# Patient Record
Sex: Male | Born: 1995 | Race: Black or African American | Hispanic: No | Marital: Single | Smoking: Never smoker
Health system: Southern US, Community
[De-identification: ages and names within clinical notes are randomized; demographics above are authoritative.]

## PROBLEM LIST (undated history)

## (undated) DIAGNOSIS — F419 Anxiety disorder, unspecified: Secondary | ICD-10-CM

## (undated) DIAGNOSIS — R51 Headache: Secondary | ICD-10-CM

## (undated) DIAGNOSIS — K219 Gastro-esophageal reflux disease without esophagitis: Secondary | ICD-10-CM

## (undated) DIAGNOSIS — F3181 Bipolar II disorder: Secondary | ICD-10-CM

## (undated) DIAGNOSIS — F32A Depression, unspecified: Secondary | ICD-10-CM

## (undated) DIAGNOSIS — G43909 Migraine, unspecified, not intractable, without status migrainosus: Secondary | ICD-10-CM

## (undated) HISTORY — DX: Migraine, unspecified, not intractable, without status migrainosus: G43.909

## (undated) HISTORY — DX: Headache: R51

## (undated) HISTORY — PX: CIRCUMCISION: SHX1350

## (undated) HISTORY — DX: Depression, unspecified: F32.A

## (undated) HISTORY — DX: Gastro-esophageal reflux disease without esophagitis: K21.9

## (undated) HISTORY — DX: Anxiety disorder, unspecified: F41.9

## (undated) HISTORY — PX: UPPER GASTROINTESTINAL ENDOSCOPY: SHX188

## (undated) HISTORY — DX: Bipolar II disorder: F31.81

---
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
## 2018-12-18 ENCOUNTER — Other Ambulatory Visit: Payer: Self-pay

## 2018-12-18 ENCOUNTER — Encounter (HOSPITAL_COMMUNITY): Payer: Self-pay

## 2018-12-18 ENCOUNTER — Ambulatory Visit (HOSPITAL_COMMUNITY)
Admission: EM | Admit: 2018-12-18 | Discharge: 2018-12-18 | Disposition: A | Discharge status: Home / Self Care | Payer: BC Managed Care – PPO | Attending: Family Medicine | Admitting: Family Medicine

## 2018-12-18 DIAGNOSIS — R42 Dizziness and giddiness: Secondary | ICD-10-CM | POA: Diagnosis present

## 2018-12-18 LAB — BASIC METABOLIC PANEL
Anion gap: 12 (ref 5–15)
BUN: 16 mg/dL (ref 6–20)
CO2: 25 mmol/L (ref 22–32)
Calcium: 9.2 mg/dL (ref 8.9–10.3)
Chloride: 100 mmol/L (ref 98–111)
Creatinine, Ser: 1.13 mg/dL (ref 0.61–1.24)
GFR calc Af Amer: >60 mL/min (ref >60)
GFR calc non Af Amer: >60 mL/min (ref >60)
Glucose, Bld: 91 mg/dL (ref 70–99)
Potassium: 4.1 mmol/L (ref 3.5–5.1)
Sodium: 137 mmol/L (ref 135–145)

## 2018-12-18 LAB — CBC
HCT: 49.3 % (ref 39.0–52.0)
Hemoglobin: 16.1 g/dL (ref 13.0–17.0)
MCH: 26.8 pg (ref 26.0–34.0)
MCHC: 32.7 g/dL (ref 30.0–36.0)
MCV: 82.2 fL (ref 80.0–100.0)
Platelets: 216 10*3/uL (ref 150–400)
RBC: 6 MIL/uL — ABNORMAL HIGH (ref 4.22–5.81)
RDW: 13.6 % (ref 11.5–15.5)
WBC: 4.1 10*3/uL (ref 4.0–10.5)
nRBC: 0 % (ref 0.0–0.2)

## 2018-12-18 MED ORDER — MECLIZINE HCL 12.5 MG PO TABS: 12.5000 mg | ORAL_TABLET | Freq: Three times a day (TID) | ORAL | 0 refills | Status: DC | PRN

## 2018-12-18 NOTE — ED Provider Notes (Signed)
[facility]    CSN: [id number] Arrival date & time: 12/18/18  1645      History   Chief Complaint Chief Complaint  Patient presents with  . Dizziness    HPI Frank Dougherty is a 23 y.o. male no contributing past medical history presenting today for evaluation of lightheadedness.  Patient states that beginning Thursday he started to develop some lightheadedness and dizziness.  Symptoms are described as a sensation of feeling uncoordinated and difficulty focusing. Has a "fuzziness". Denies vision changes.  Has had a bitemporal headache.  Denies recent illness or URI symptoms.  Denies hearing changes or ringing in the ears.  Denies ear pain.  Denies history of similar.  Denies change in diet sleep.  Denies recent head injury or trauma.  No vomiting.  Feels the symptoms are triggered when he stands too quickly, changes position or turns his head.  Denies use of recreational drugs.  Denies tobacco use.  Denies chest pain or shortness of breath.  Does not take any daily medicines.  Denies any known medical history.  Drinks in between 100 to 133 ounces of water daily.  Works at [employer].  HPI  Past Medical History:  Diagnosis Date  . WUJWJXBJ(478.2Headache(784.0)     Patient Active Problem List   Diagnosis Date Noted  . Strain of quadriceps tendon 06/08/2012    Past Surgical History:  Procedure Laterality Date  . CIRCUMCISION         Home Medications    Prior to Admission medications   Medication Sig Start Date End Date Taking? Authorizing Provider  meclizine (ANTIVERT) 12.5 MG tablet Take 1 tablet (12.5 mg total) by mouth 3 (three) times daily as needed for dizziness. 12/18/18   Japheth Diekman, Junius CreamerHallie C, PA-C    Family History Family History  Problem Relation Age of Onset  . Hyperlipidemia Father   . Depression Mother   . Heart Problems Maternal Grandmother   . Depression Maternal Grandmother   . Cancer Paternal Grandmother   . Autism Cousin        Paternal 1st Cousin    Social  History Social History   Tobacco Use  . Smoking status: Never Smoker  . Smokeless tobacco: Never Used  Substance Use Topics  . Alcohol use: Yes    Comment: socially  . Drug use: No     Allergies   Amitriptyline, Prednisone, and Zofran [ondansetron hcl]   Review of Systems Review of Systems  Constitutional: Negative for fatigue and fever.  HENT: Negative for congestion, sinus pressure and sore throat.   Eyes: Negative for photophobia, pain and visual disturbance.  Respiratory: Negative for cough and shortness of breath.   Cardiovascular: Negative for chest pain.  Gastrointestinal: Negative for abdominal pain, nausea and vomiting.  Genitourinary: Negative for decreased urine volume and hematuria.  Musculoskeletal: Negative for myalgias, neck pain and neck stiffness.  Neurological: Positive for dizziness, light-headedness and headaches. Negative for syncope, facial asymmetry, speech difficulty, weakness and numbness.     Physical Exam Triage Vital Signs ED Triage Vitals  Enc Vitals Group     BP 12/18/18 1706 108/76     Pulse Rate 12/18/18 1706 79     Resp 12/18/18 1706 15     Temp 12/18/18 1706 97.9 F (36.6 C)     Temp Source 12/18/18 1706 Oral     SpO2 12/18/18 1706 99 %     Weight --      Height --      Head  Circumference --      Peak Flow --      Pain Score 12/18/18 1704 5     Pain Loc --      Pain Edu? --      Excl. in [place]? --    Orthostatic VS for the past 24 hrs:  BP- Lying Pulse- Lying BP- Sitting Pulse- Sitting BP- Standing at 0 minutes Pulse- Standing at 0 minutes  12/18/18 1756 108/74 72 117/79 99 105/69 84    Updated Vital Signs BP 108/76 (BP Location: Left Arm)   Pulse 79   Temp 97.9 F (36.6 C) (Oral)   Resp 15   SpO2 99%   Visual Acuity Right Eye Distance:   Left Eye Distance:   Bilateral Distance:    Right Eye Near:   Left Eye Near:    Bilateral Near:     Physical Exam Vitals signs and nursing note reviewed.  Constitutional:       Appearance: Normal appearance. He is well-developed and normal weight.  HENT:     Head: Normocephalic and atraumatic.     Ears:     Comments: Bilateral cerumen impaction    Mouth/Throat:     Comments: Oral mucosa pink and moist, no tonsillar enlargement or exudate. Posterior pharynx patent and nonerythematous, no uvula deviation or swelling. Normal phonation. Palate elevates symmetrically Eyes:     Extraocular Movements: Extraocular movements intact.     Conjunctiva/sclera: Conjunctivae normal.     Pupils: Pupils are equal, round, and reactive to light.  Neck:     Musculoskeletal: Neck supple.     Comments: Full active range of motion of neck Cardiovascular:     Rate and Rhythm: Normal rate and regular rhythm.     Heart sounds: No murmur.  Pulmonary:     Effort: Pulmonary effort is normal. No respiratory distress.     Breath sounds: Normal breath sounds.     Comments: Breathing comfortably at rest, CTABL, no wheezing, rales or other adventitious sounds auscultated Abdominal:     Palpations: Abdomen is soft.     Tenderness: There is no abdominal tenderness.  Skin:    General: Skin is warm and dry.  Neurological:     General: No focal deficit present.     Mental Status: He is alert and oriented to person, place, and time. Mental status is at baseline.     Comments: Patient A&O x3, cranial nerves II-XII grossly intact, strength at shoulders, hips and knees 5/5, equal bilaterally, patellar reflex 2+ bilaterally.  Negative Romberg and Pronator Drift. Gait without abnormality. Weakly positive Dix-Hallpike, did have slight reproduction of symptoms, but no nystagmus      UC Treatments / Results  Labs (all labs ordered are listed, but only abnormal results are displayed) Labs Reviewed  CBC  BASIC METABOLIC PANEL    EKG   Radiology No results found.  Procedures Procedures (including critical care time)  Medications Ordered in UC Medications - No data to display  Initial  Impression / Assessment and Plan / UC Course  I have reviewed the triage vital signs and the nursing notes.  Pertinent labs & imaging results that were available during my care of the patient were reviewed by me and considered in my medical decision making (see chart for details).     No neuro deficits, negative orthostatics.  Patient seems to be drinking appropriate amount of water.  Do not suspect dehydration.  Reproducible symptoms with Dix-Hallpike, will treat for BP PV, recommending  Epley/logroll maneuver, meclizine as needed.  Discussed general precautions.  Checking BMP and CBC as well.  Will call if abnormal.  Continue to monitor,Discussed strict return precautions. Patient verbalized understanding and is agreeable with plan.  Final Clinical Impressions(s) / UC Diagnoses   Final diagnoses:  Dizziness  Vertigo     Discharge Instructions     Symptoms likely vertigo Please read attched Try epley maneuver and " log roll/BBQ" maneuver 2-3 times daily to help decrease symptoms- look up on youtube with 'vertigo' Use meclizine as needed for symptoms-do not drive/work after taking, will cause drowsiness Continue to drink plenty of fluids I will call if blood work abnormal  Please follow up here or emergency room if developing worsening or persistent symptoms    ED Prescriptions    Medication Sig Dispense Auth. Provider   meclizine (ANTIVERT) 12.5 MG tablet Take 1 tablet (12.5 mg total) by mouth 3 (three) times daily as needed for dizziness. 20 tablet Adylynn Hertenstein, [place] C, PA-C     Controlled Substance Prescriptions [place] Controlled Substance Registry consulted? Not Applicable   Lew Dawes, [place] 12/18/18 1806

## 2018-12-18 NOTE — Discharge Instructions (Signed)
Symptoms likely vertigo Please read attched Try epley maneuver and " log roll/BBQ" maneuver 2-3 times daily to help decrease symptoms- look up on youtube with 'vertigo' Use meclizine as needed for symptoms-do not drive/work after taking, will cause drowsiness Continue to drink plenty of fluids I will call if blood work abnormal  Please follow up here or emergency room if developing worsening or persistent symptoms

## 2018-12-18 NOTE — ED Triage Notes (Signed)
Patient presents to Urgent Care with complaints of feelings of lightheadedness since the past few days. Patient reports he tries to stay hydrated, does not think he is diabetic.

## 2018-12-22 ENCOUNTER — Telehealth (HOSPITAL_COMMUNITY): Payer: Self-pay | Admitting: Emergency Medicine

## 2018-12-22 NOTE — Telephone Encounter (Signed)
Attempted to reach patient about labs. No answer at this time.

## 2019-01-09 ENCOUNTER — Other Ambulatory Visit: Payer: Self-pay

## 2019-01-09 ENCOUNTER — Encounter (HOSPITAL_COMMUNITY): Payer: Self-pay

## 2019-01-09 ENCOUNTER — Ambulatory Visit (HOSPITAL_COMMUNITY)
Admission: EM | Admit: 2019-01-09 | Discharge: 2019-01-09 | Disposition: A | Discharge status: Home / Self Care | Payer: BC Managed Care – PPO | Attending: Family Medicine | Admitting: Family Medicine

## 2019-01-09 DIAGNOSIS — J0110 Acute frontal sinusitis, unspecified: Secondary | ICD-10-CM | POA: Diagnosis not present

## 2019-01-09 MED ORDER — AMOXICILLIN-POT CLAVULANATE 875-125 MG PO TABS: 1.0000 | ORAL_TABLET | Freq: Two times a day (BID) | ORAL | 0 refills | Status: DC

## 2019-01-09 MED ORDER — FLUTICASONE PROPIONATE 50 MCG/ACT NA SUSP: 1.0000 | Freq: Every day | NASAL | 2 refills | Status: DC

## 2019-01-09 MED ORDER — CETIRIZINE HCL 10 MG PO TABS: 10.0000 mg | ORAL_TABLET | Freq: Every day | ORAL | 0 refills | Status: DC

## 2019-01-09 MED ORDER — DEXAMETHASONE SODIUM PHOSPHATE 10 MG/ML IJ SOLN
10.0000 mg | Freq: Once | INTRAMUSCULAR | Status: AC
  Administered 2019-01-09: 14:00:00 10 mg via INTRAMUSCULAR

## 2019-01-09 MED ORDER — DEXAMETHASONE SODIUM PHOSPHATE 10 MG/ML IJ SOLN
INTRAMUSCULAR | Status: AC
  Filled 2019-01-09: qty 1

## 2019-01-09 NOTE — Discharge Instructions (Signed)
Treating you for sinus infection. I believe this could be the cause of your symptoms.  Recommend follow up with primary if your symptoms continue  Flonase and zyrtec daily.  Steroid injection here in clinic Augmentin for infection  Take the medication as prescribed.

## 2019-01-09 NOTE — ED Triage Notes (Signed)
Patient presents to Urgent Care with complaints of dizziness ongoing since his last visit a few weeks ago. Patient reports he will need a work note because he was too dizzy to go in today. Endorses some nausea with the dizziness, did take his last meclizine yesterday which helps but now he is out of it. Pt reports staying hydrated.

## 2019-01-09 NOTE — ED Provider Notes (Signed)
[facility]    CSN: [id number] Arrival date & time: 01/09/19  1227      History   Chief Complaint Chief Complaint  Patient presents with  . Dizziness    HPI Frank Dougherty is a 23 y.o. male.   Patient is a 23 year old male with past medical history of headache.  He presents today with intermittent dizziness that is been ongoing over the past couple weeks.  He was seen here previously for same.  Reports some days are more severe than others with his symptoms.  He was having pretty severe dizziness last night and took meclizine with some relief.  He has had some associated nausea, postnasal drip, thick mucus at times with foul smell.  Frontal headache described as tension and pressure.  No vomiting, photophobia, vision changes.  He feels overall weak and has had lack of appetite.  No fever, cough, sore throat or ear pain.  No chest pain or shortness of breath.  No recent sick contacts or recent traveling.  ROS per HPI      Past Medical History:  Diagnosis Date  . WUJWJXBJ(478.2Headache(784.0)     Patient Active Problem List   Diagnosis Date Noted  . Strain of quadriceps tendon 06/08/2012    Past Surgical History:  Procedure Laterality Date  . CIRCUMCISION         Home Medications    Prior to Admission medications   Medication Sig Start Date End Date Taking? Authorizing Provider  amoxicillin-clavulanate (AUGMENTIN) 875-125 MG tablet Take 1 tablet by mouth every 12 (twelve) hours. 01/09/19   Dahlia ByesBast, Jahn Franchini A, NP  cetirizine (ZYRTEC) 10 MG tablet Take 1 tablet (10 mg total) by mouth daily. 01/09/19   Lessie Funderburke, Gloris Manchesterraci A, NP  fluticasone (FLONASE) 50 MCG/ACT nasal spray Place 1 spray into both nostrils daily. 01/09/19   Dahlia ByesBast, Elfie Costanza A, NP  meclizine (ANTIVERT) 12.5 MG tablet Take 1 tablet (12.5 mg total) by mouth 3 (three) times daily as needed for dizziness. 12/18/18   Wieters, Junius CreamerHallie C, PA-C    Family History Family History  Problem Relation Age of Onset  . Hyperlipidemia Father    . Depression Mother   . Heart Problems Maternal Grandmother   . Depression Maternal Grandmother   . Cancer Paternal Grandmother   . Autism Cousin        Paternal 1st Cousin    Social History Social History   Tobacco Use  . Smoking status: Never Smoker  . Smokeless tobacco: Never Used  Substance Use Topics  . Alcohol use: Yes    Comment: socially  . Drug use: No     Allergies   Amitriptyline, Prednisone, and Zofran [ondansetron hcl]   Review of Systems Review of Systems   Physical Exam Triage Vital Signs ED Triage Vitals  Enc Vitals Group     BP 01/09/19 1400 112/79     Pulse Rate 01/09/19 1400 84     Resp 01/09/19 1400 16     Temp 01/09/19 1400 98 F (36.7 C)     Temp Source 01/09/19 1400 Oral     SpO2 01/09/19 1400 98 %     Weight --      Height --      Head Circumference --      Peak Flow --      Pain Score 01/09/19 1251 4     Pain Loc --      Pain Edu? --      Excl. in [place]? --  No data found.  Updated Vital Signs BP 112/79 (BP Location: Left Arm)   Pulse 84   Temp 98 F (36.7 C) (Oral)   Resp 16   SpO2 98%   Visual Acuity Right Eye Distance:   Left Eye Distance:   Bilateral Distance:    Right Eye Near:   Left Eye Near:    Bilateral Near:     Physical Exam Vitals signs and nursing note reviewed.  Constitutional:      General: He is not in acute distress.    Appearance: Normal appearance. He is not ill-appearing, toxic-appearing or diaphoretic.  HENT:     Head: Normocephalic and atraumatic.     Right Ear: Tympanic membrane and ear canal normal.     Left Ear: Tympanic membrane and ear canal normal.     Nose: Congestion present.     Right Turbinates: Swollen.     Left Turbinates: Swollen.     Right Sinus: Frontal sinus tenderness present.     Left Sinus: Frontal sinus tenderness present.     Mouth/Throat:     Comments: Elongated uvula  Eyes:     Conjunctiva/sclera: Conjunctivae normal.  Neck:     Musculoskeletal: Normal range of  motion.  Pulmonary:     Effort: Pulmonary effort is normal.  Musculoskeletal: Normal range of motion.  Skin:    General: Skin is warm and dry.  Neurological:     Mental Status: He is alert.  Psychiatric:        Mood and Affect: Mood normal.      UC Treatments / Results  Labs (all labs ordered are listed, but only abnormal results are displayed) Labs Reviewed - No data to display  EKG   Radiology No results found.  Procedures Procedures (including critical care time)  Medications Ordered in UC Medications  dexamethasone (DECADRON) injection 10 mg (10 mg Intramuscular Given 01/09/19 1344)  dexamethasone (DECADRON) 10 MG/ML injection (has no administration in time range)    Initial Impression / Assessment and Plan / UC Course  I have reviewed the triage vital signs and the nursing notes.  Pertinent labs & imaging results that were available during my care of the patient were reviewed by me and considered in my medical decision making (see chart for details).     Bacterial sinusitis-symptoms consistent with this. Has had symptoms for approximately 3 weeks or more. Treating with dexamethasone injection here in clinic.  Sending Flonase and Zyrtec along with Augmentin to the pharmacy to treat Recommended follow-up primary care for any continued problems.  Final Clinical Impressions(s) / UC Diagnoses   Final diagnoses:  Acute non-recurrent frontal sinusitis     Discharge Instructions     Treating you for sinus infection. I believe this could be the cause of your symptoms.  Recommend follow up with primary if your symptoms continue  Flonase and zyrtec daily.  Steroid injection here in clinic Augmentin for infection  Take the medication as prescribed.     ED Prescriptions    Medication Sig Dispense Auth. Provider   fluticasone (FLONASE) 50 MCG/ACT nasal spray Place 1 spray into both nostrils daily. 16 g Randall Colden A, NP   cetirizine (ZYRTEC) 10 MG tablet Take  1 tablet (10 mg total) by mouth daily. 30 tablet Yarianna Varble A, NP   amoxicillin-clavulanate (AUGMENTIN) 875-125 MG tablet Take 1 tablet by mouth every 12 (twelve) hours. 14 tablet Thu Baggett A, NP     PDMP not reviewed this encounter.  Dahlia Byes A, NP 01/09/19 [phone]

## 2021-10-15 ENCOUNTER — Emergency Department (HOSPITAL_COMMUNITY)
Admission: EM | Admit: 2021-10-15 | Discharge: 2021-10-15 | Disposition: A | Discharge status: Home / Self Care | Payer: Managed Care, Other (non HMO) | Attending: Emergency Medicine | Admitting: Emergency Medicine

## 2021-10-15 ENCOUNTER — Other Ambulatory Visit: Payer: Self-pay

## 2021-10-15 ENCOUNTER — Encounter (HOSPITAL_COMMUNITY): Payer: Self-pay

## 2021-10-15 DIAGNOSIS — R42 Dizziness and giddiness: Secondary | ICD-10-CM | POA: Diagnosis not present

## 2021-10-15 DIAGNOSIS — F410 Panic disorder [episodic paroxysmal anxiety] without agoraphobia: Secondary | ICD-10-CM | POA: Diagnosis present

## 2021-10-15 DIAGNOSIS — R531 Weakness: Secondary | ICD-10-CM | POA: Insufficient documentation

## 2021-10-15 DIAGNOSIS — R0602 Shortness of breath: Secondary | ICD-10-CM | POA: Insufficient documentation

## 2021-10-15 LAB — COMPREHENSIVE METABOLIC PANEL
ALT: 26 U/L (ref 0–44)
AST: 23 U/L (ref 15–41)
Albumin: 4.4 g/dL (ref 3.5–5.0)
Alkaline Phosphatase: 65 U/L (ref 38–126)
Anion gap: 6 (ref 5–15)
BUN: 15 mg/dL (ref 6–20)
CO2: 26 mmol/L (ref 22–32)
Calcium: 9.4 mg/dL (ref 8.9–10.3)
Chloride: 107 mmol/L (ref 98–111)
Creatinine, Ser: 1.13 mg/dL (ref 0.61–1.24)
GFR, Estimated: >60 mL/min (ref >60)
Glucose, Bld: 99 mg/dL (ref 70–99)
Potassium: 4 mmol/L (ref 3.5–5.1)
Sodium: 139 mmol/L (ref 135–145)
Total Bilirubin: 1 mg/dL (ref 0.3–1.2)
Total Protein: 7.5 g/dL (ref 6.5–8.1)

## 2021-10-15 LAB — CBC WITH DIFFERENTIAL/PLATELET
Abs Immature Granulocytes: 0.01 10*3/uL (ref 0.00–0.07)
Basophils Absolute: 0 10*3/uL (ref 0.0–0.1)
Basophils Relative: 1 %
Eosinophils Absolute: 0 10*3/uL (ref 0.0–0.5)
Eosinophils Relative: 1 %
HCT: 46.7 % (ref 39.0–52.0)
Hemoglobin: 15.6 g/dL (ref 13.0–17.0)
Immature Granulocytes: 0 %
Lymphocytes Relative: 24 %
Lymphs Abs: 1.1 10*3/uL (ref 0.7–4.0)
MCH: 27 pg (ref 26.0–34.0)
MCHC: 33.4 g/dL (ref 30.0–36.0)
MCV: 80.8 fL (ref 80.0–100.0)
Monocytes Absolute: 0.5 10*3/uL (ref 0.1–1.0)
Monocytes Relative: 10 %
Neutro Abs: 3.1 10*3/uL (ref 1.7–7.7)
Neutrophils Relative %: 64 %
Platelets: 201 10*3/uL (ref 150–400)
RBC: 5.78 MIL/uL (ref 4.22–5.81)
RDW: 13.5 % (ref 11.5–15.5)
WBC: 4.8 10*3/uL (ref 4.0–10.5)
nRBC: 0 % (ref 0.0–0.2)

## 2021-10-15 LAB — BLOOD GAS, VENOUS
Acid-Base Excess: 5 mmol/L — ABNORMAL HIGH (ref 0.0–2.0)
Bicarbonate: 31.5 mmol/L — ABNORMAL HIGH (ref 20.0–28.0)
O2 Saturation: 43.7 %
Patient temperature: 37
pCO2, Ven: 52 mmHg (ref 44–60)
pH, Ven: 7.39 (ref 7.25–7.43)
pO2, Ven: <31 mmHg — CL (ref 32–45)

## 2021-10-15 NOTE — ED Provider Notes (Signed)
[facility]EMERGENCY [facility]PT Provider Note   CSN: [id number] Arrival date & time: 10/15/21  2030     History  Chief Complaint  Patient presents with   Dizziness   Shortness of Breath    Frank Dougherty is a 26 y.o. male.  HPI Patient was at work today when he suddenly had weakness in his arms and legs.  He knelt down, and had trouble standing up.  He did not lose consciousness.  EMS was contacted and they brought him here for evaluation.  During the episode he had a sensation of dizziness and shortness of breath.  He has a remote history of anxiety and panic attack, 7 years ago had a similar episode.  Today he was working, as usual and did not have any significant stress.  He does not currently take any medicines.  He feels better now but states his arms and legs are both shaky when he tries to move them.  He denies headache.  He has been treated in the past for tension headache.    Home Medications Prior to Admission medications   Medication Sig Start Date End Date Taking? Authorizing Provider  amoxicillin-clavulanate (AUGMENTIN) 875-125 MG tablet Take 1 tablet by mouth every 12 (twelve) hours. 01/09/19   Dahlia Byes A, NP  cetirizine (ZYRTEC) 10 MG tablet Take 1 tablet (10 mg total) by mouth daily. 01/09/19   Bast, Gloris [place] A, NP  fluticasone (FLONASE) 50 MCG/ACT nasal spray Place 1 spray into both nostrils daily. 01/09/19   Dahlia Byes A, NP  meclizine (ANTIVERT) 12.5 MG tablet Take 1 tablet (12.5 mg total) by mouth 3 (three) times daily as needed for dizziness. 12/18/18   Wieters, Hallie C, PA-C      Allergies    Amitriptyline, Prednisone, and Zofran [ondansetron hcl]    Review of Systems   Review of Systems  Physical Exam Updated Vital Signs BP 130/81 (BP Location: Right Arm)   Pulse 78   Temp 97.8 F (36.6 C) (Oral)   Resp 16   Ht 5\' 9"  (1.753 m)   Wt 66.2 kg   SpO2 100%   BMI 21.56 kg/m  Physical Exam Vitals and nursing note reviewed.  Constitutional:       Appearance: He is well-developed. He is not ill-appearing.  HENT:     Head: Normocephalic and atraumatic.     Right Ear: External ear normal.     Left Ear: External ear normal.  Eyes:     Conjunctiva/sclera: Conjunctivae normal.     Pupils: Pupils are equal, round, and reactive to light.  Neck:     Trachea: Phonation normal.  Cardiovascular:     Rate and Rhythm: Normal rate.  Pulmonary:     Effort: Pulmonary effort is normal.  Abdominal:     General: There is no distension.  Musculoskeletal:        General: Normal range of motion.     Cervical back: Normal range of motion and neck supple.  Skin:    General: Skin is warm and dry.  Neurological:     Mental Status: He is alert and oriented to person, place, and time.     Cranial Nerves: No cranial nerve deficit.     Sensory: No sensory deficit.     Motor: No abnormal muscle tone.     Coordination: Coordination normal.  Psychiatric:        Mood and Affect: Mood normal.        Behavior: Behavior normal.  Thought Content: Thought content normal.        Judgment: Judgment normal.     ED Results / Procedures / Treatments   Labs (all labs ordered are listed, but only abnormal results are displayed) Labs Reviewed  COMPREHENSIVE METABOLIC PANEL  BLOOD GAS, VENOUS  CBC WITH DIFFERENTIAL/PLATELET    EKG None  Radiology No results found.  Procedures Procedures    Medications Ordered in ED Medications - No data to display  ED Course/ Medical Decision Making/ A&P                           Medical Decision Making Patient presenting with signs and symptoms of panic attack.  No significant ongoing anxiety or stress.  He presents [place] for evaluation.  We will order screening evaluation to check for cardiopulmonary instability and occult electrolyte abnormalities.  Doubt stroke, spinal myelopathy, acute coronary syndrome.  Problems Addressed: Panic attack: acute illness or injury  Amount and/or Complexity of  Data Reviewed Independent Historian:     Details: He is a cogent historian Labs: ordered.    Details: CBC, metabolic panel, venous blood gas-all normal findings ECG/medicine tests: ordered and independent interpretation performed.    Details: Cardiac monitor -- normal sinus rhythm  Risk Decision regarding hospitalization. Risk Details: Patient with transient symptoms, likely panic attack, screening evaluation is negative for acute metabolic disorders, cardiac disorders, hemodynamic instability.  He does not require hospitalization for management of these disorders.  I suspect nonspecific anxiety versus panic attack.  He can be discharged as stable, with outpatient management as needed.  He does not require hospitalization.           Final Clinical Impression(s) / ED Diagnoses Final diagnoses:  None    Rx / DC Orders ED Discharge Orders     None         Mancel Bale, MD 10/17/21 1034

## 2021-10-15 NOTE — Discharge Instructions (Signed)
It appears that you had a panic attack today.  If this continues you will need to have some treatment for it.  Make sure you are getting plenty of rest and try to eat a regular diet.  Return here, if needed.

## 2021-10-15 NOTE — ED Triage Notes (Addendum)
BIBA from work for sudden onset of dizziness and SHOB, has hx anxiety, vss, normal EKG  18 LAC

## 2022-09-29 ENCOUNTER — Emergency Department (HOSPITAL_COMMUNITY): Payer: Managed Care, Other (non HMO)

## 2022-09-29 ENCOUNTER — Emergency Department (HOSPITAL_COMMUNITY)
Admission: EM | Admit: 2022-09-29 | Discharge: 2022-09-29 | Disposition: A | Discharge status: Home / Self Care | Payer: Managed Care, Other (non HMO) | Attending: Emergency Medicine | Admitting: Emergency Medicine

## 2022-09-29 ENCOUNTER — Other Ambulatory Visit: Payer: Self-pay

## 2022-09-29 ENCOUNTER — Encounter (HOSPITAL_COMMUNITY): Payer: Self-pay

## 2022-09-29 ENCOUNTER — Encounter: Payer: Self-pay | Admitting: Emergency Medicine

## 2022-09-29 ENCOUNTER — Ambulatory Visit
Admission: EM | Admit: 2022-09-29 | Discharge: 2022-09-29 | Disposition: A | Discharge status: Other Acute Inpatient Hospital | Payer: Managed Care, Other (non HMO)

## 2022-09-29 DIAGNOSIS — R1084 Generalized abdominal pain: Secondary | ICD-10-CM | POA: Diagnosis not present

## 2022-09-29 DIAGNOSIS — D72819 Decreased white blood cell count, unspecified: Secondary | ICD-10-CM | POA: Insufficient documentation

## 2022-09-29 DIAGNOSIS — R1013 Epigastric pain: Secondary | ICD-10-CM | POA: Insufficient documentation

## 2022-09-29 DIAGNOSIS — R1032 Left lower quadrant pain: Secondary | ICD-10-CM | POA: Diagnosis present

## 2022-09-29 LAB — CBC
HCT: 49 % (ref 39.0–52.0)
Hemoglobin: 15.9 g/dL (ref 13.0–17.0)
MCH: 26.5 pg (ref 26.0–34.0)
MCHC: 32.4 g/dL (ref 30.0–36.0)
MCV: 81.7 fL (ref 80.0–100.0)
Platelets: 209 10*3/uL (ref 150–400)
RBC: 6 MIL/uL — ABNORMAL HIGH (ref 4.22–5.81)
RDW: 13.1 % (ref 11.5–15.5)
WBC: 2.9 10*3/uL — ABNORMAL LOW (ref 4.0–10.5)
nRBC: 0 % (ref 0.0–0.2)

## 2022-09-29 LAB — COMPREHENSIVE METABOLIC PANEL
ALT: 16 U/L (ref 0–44)
AST: 19 U/L (ref 15–41)
Albumin: 4.4 g/dL (ref 3.5–5.0)
Alkaline Phosphatase: 63 U/L (ref 38–126)
Anion gap: 12 (ref 5–15)
BUN: 11 mg/dL (ref 6–20)
CO2: 26 mmol/L (ref 22–32)
Calcium: 9.4 mg/dL (ref 8.9–10.3)
Chloride: 99 mmol/L (ref 98–111)
Creatinine, Ser: 1.11 mg/dL (ref 0.61–1.24)
GFR, Estimated: >60 mL/min (ref >60)
Glucose, Bld: 99 mg/dL (ref 70–99)
Potassium: 3.7 mmol/L (ref 3.5–5.1)
Sodium: 137 mmol/L (ref 135–145)
Total Bilirubin: 0.7 mg/dL (ref 0.3–1.2)
Total Protein: 7.3 g/dL (ref 6.5–8.1)

## 2022-09-29 LAB — URINALYSIS, ROUTINE W REFLEX MICROSCOPIC
Bilirubin Urine: NEGATIVE
Glucose, UA: NEGATIVE mg/dL
Hgb urine dipstick: NEGATIVE
Ketones, ur: NEGATIVE mg/dL
Leukocytes,Ua: NEGATIVE
Nitrite: NEGATIVE
Protein, ur: NEGATIVE mg/dL
Specific Gravity, Urine: 1.018 (ref 1.005–1.030)
pH: 7 (ref 5.0–8.0)

## 2022-09-29 LAB — TYPE AND SCREEN
ABO/RH(D): O POS
Antibody Screen: NEGATIVE

## 2022-09-29 LAB — LIPASE, BLOOD: Lipase: 27 U/L (ref 11–51)

## 2022-09-29 MED ORDER — FAMOTIDINE 20 MG PO TABS
20.0000 mg | ORAL_TABLET | Freq: Once | ORAL | Status: AC
  Administered 2022-09-29: 20 mg via ORAL
  Filled 2022-09-29: qty 1

## 2022-09-29 MED ORDER — IOHEXOL 350 MG/ML SOLN
60.0000 mL | Freq: Once | INTRAVENOUS | Status: AC | PRN
  Administered 2022-09-29: 60 mL via INTRAVENOUS

## 2022-09-29 MED ORDER — ACETAMINOPHEN 325 MG PO TABS
650.0000 mg | ORAL_TABLET | Freq: Once | ORAL | Status: AC
  Administered 2022-09-29: 650 mg via ORAL
  Filled 2022-09-29: qty 2

## 2022-09-29 MED ORDER — SODIUM CHLORIDE 0.9 % IV BOLUS
1000.0000 mL | Freq: Once | INTRAVENOUS | Status: AC
  Administered 2022-09-29: 1000 mL via INTRAVENOUS

## 2022-09-29 NOTE — ED Provider Notes (Signed)
[facility]    CSN: [id number] Arrival date & time: 09/29/22  1216      History   Chief Complaint Chief Complaint  Patient presents with   Abdominal Pain    HPI Frank Dougherty is a 27 y.o. male.   Patient complains of nominal pain for the past week.  Patient reports that he had vomiting and diarrhea.  Patient took Pepto-Bismol with some relief patient reports having black stool after Pepto but he is now seeing some bright red blood.  Patient reports he has had persistent pain in his left upper abdomen in his left and right lower abdomen.  Patient complains of pain when he walks.  Patient reports difficulty getting comfortable  The history is provided by the patient. No language interpreter was used.  Abdominal Pain Pain location:  LUQ, LLQ and RLQ   Past Medical History:  Diagnosis Date   Headache(784.0)     Patient Active Problem List   Diagnosis Date Noted   Strain of quadriceps tendon 06/08/2012    Past Surgical History:  Procedure Laterality Date   CIRCUMCISION         Home Medications    Prior to Admission medications   Medication Sig Start Date End Date Taking? Authorizing Provider  amoxicillin-clavulanate (AUGMENTIN) 875-125 MG tablet Take 1 tablet by mouth every 12 (twelve) hours. Patient not taking: Reported on 09/29/2022 01/09/19   Dahlia Byes A, NP  cetirizine (ZYRTEC) 10 MG tablet Take 1 tablet (10 mg total) by mouth daily. 01/09/19   Bast, Gloris Manchester A, NP  fluticasone (FLONASE) 50 MCG/ACT nasal spray Place 1 spray into both nostrils daily. 01/09/19   Dahlia Byes A, NP  meclizine (ANTIVERT) 12.5 MG tablet Take 1 tablet (12.5 mg total) by mouth 3 (three) times daily as needed for dizziness. 12/18/18   Wieters, Junius Creamer, PA-C    Family History Family History  Problem Relation Age of Onset   Hyperlipidemia Father    Depression Mother    Heart Problems Maternal Grandmother    Depression Maternal Grandmother    Cancer Paternal Grandmother     Autism Cousin        Paternal 1st Cousin    Social History Social History   Tobacco Use   Smoking status: Never   Smokeless tobacco: Never  Vaping Use   Vaping Use: Never used  Substance Use Topics   Alcohol use: Yes    Comment: socially   Drug use: No     Allergies   Amitriptyline, Prednisone, and Zofran [ondansetron hcl]   Review of Systems Review of Systems  Gastrointestinal:  Positive for abdominal pain.  All other systems reviewed and are negative.    Physical Exam Triage Vital Signs ED Triage Vitals [09/29/22 1336]  Enc Vitals Group     BP 127/75     Pulse Rate 92     Resp 18     Temp 97.9 F (36.6 C)     Temp Source Oral     SpO2 99 %     Weight      Height      Head Circumference      Peak Flow      Pain Score 3     Pain Loc      Pain Edu?      Excl. in GC?    No data found.  Updated Vital Signs BP 127/75 (BP Location: Left Arm)   Pulse 92   Temp 97.9 F (36.6  C) (Oral)   Resp 18   SpO2 99%   Visual Acuity Right Eye Distance:   Left Eye Distance:   Bilateral Distance:    Right Eye Near:   Left Eye Near:    Bilateral Near:     Physical Exam Vitals and nursing note reviewed.  Constitutional:      Appearance: He is well-developed.  HENT:     Head: Normocephalic.  Cardiovascular:     Rate and Rhythm: Normal rate and regular rhythm.  Pulmonary:     Effort: Pulmonary effort is normal.  Abdominal:     General: Abdomen is flat. Bowel sounds are normal. There is no distension.     Palpations: Abdomen is soft.     Tenderness: There is abdominal tenderness in the right lower quadrant, left upper quadrant and left lower quadrant.  Musculoskeletal:        General: Normal range of motion.     Cervical back: Normal range of motion.  Skin:    General: Skin is warm.  Neurological:     Mental Status: He is alert and oriented to person, place, and time.      UC Treatments / Results  Labs (all labs ordered are listed, but only  abnormal results are displayed) Labs Reviewed - No data to display  EKG   Radiology No results found.  Procedures Procedures (including critical care time)  Medications Ordered in UC Medications - No data to display  Initial Impression / Assessment and Plan / UC Course  I have reviewed the triage vital signs and the nursing notes.  Pertinent labs & imaging results that were available during my care of the patient were reviewed by me and considered in my medical decision making (see chart for details).    Patient has pain to palpation.  He has guarding.  I advised patient I think he would benefit from laboratory evaluation and assessment in the emergency department.  Final Clinical Impressions(s) / UC Diagnoses   Final diagnoses:  Generalized abdominal pain   Discharge Instructions   None    ED Prescriptions   None    PDMP not reviewed this encounter. An After Visit Summary was printed and given to the patient.    Elson Areas, [place] 09/29/22 1407

## 2022-09-29 NOTE — ED Triage Notes (Signed)
Pt sts some upper abd pain with nausea x 1 week; pt sts one week ago had day of N/V/D but sts stomach upset since

## 2022-09-29 NOTE — ED Notes (Signed)
Patient is being discharged from the Urgent Care and sent to the Emergency Department via POV . Per KS, patient is in need of higher level of care due to abd pain. Patient is aware and verbalizes understanding of plan of care.  Vitals:   09/29/22 1336  BP: 127/75  Pulse: 92  Resp: 18  Temp: 97.9 F (36.6 C)  SpO2: 99%

## 2022-09-29 NOTE — ED Triage Notes (Signed)
Pt c/o upper abdominal pain x1 week.  Pain score 7/10.  Pt reports nausea and diarrhea during first few days, but denies since.  Pt reports dark brown w/ bright red stool yesterday.  Pt reports he has been taking Pepto-Bismol.

## 2022-09-29 NOTE — ED Provider Notes (Signed)
[place] EMERGENCY DEPARTMENT AT [facility] Provider Note   CSN: [id number] Arrival date & time: 09/29/22  1532     History  Chief Complaint  Patient presents with   Abdominal Pain    Frank Dougherty is a 27 y.o. male.  Patient is a 26 yo male with no significant pmh presenting for abdominal pain. Patient admits to lower abdominal pain with radiation to the left lower abdomen x 8 days. Admits to episodes of nausea and vomiting last week that is now resolved. Had small amount of bright red blood in stool yesterday. Did take Peptobismol last week with nausea/vomiting and had constipation afterward for 7 days. Denies current fever, chills, nausea, or vomiting. States he has worsening of lower abdominal pain immediately after eating.   The history is provided by the patient. No language interpreter was used.  Abdominal Pain Associated symptoms: no chest pain, no chills, no cough, no dysuria, no fever, no hematuria, no shortness of breath, no sore throat and no vomiting        Home Medications Prior to Admission medications   Medication Sig Start Date End Date Taking? Authorizing Provider  amoxicillin-clavulanate (AUGMENTIN) 875-125 MG tablet Take 1 tablet by mouth every 12 (twelve) hours. Patient not taking: Reported on 09/29/2022 01/09/19   Dahlia Byes A, NP  cetirizine (ZYRTEC) 10 MG tablet Take 1 tablet (10 mg total) by mouth daily. 01/09/19   Bast, Gloris Manchester A, NP  fluticasone (FLONASE) 50 MCG/ACT nasal spray Place 1 spray into both nostrils daily. 01/09/19   Dahlia Byes A, NP  meclizine (ANTIVERT) 12.5 MG tablet Take 1 tablet (12.5 mg total) by mouth 3 (three) times daily as needed for dizziness. 12/18/18   Wieters, Hallie C, PA-C      Allergies    Amitriptyline, Prednisone, and Zofran [ondansetron hcl]    Review of Systems   Review of Systems  Constitutional:  Negative for chills and fever.  HENT:  Negative for ear pain and sore throat.   Eyes:  Negative for pain and  visual disturbance.  Respiratory:  Negative for cough and shortness of breath.   Cardiovascular:  Negative for chest pain and palpitations.  Gastrointestinal:  Positive for abdominal pain and blood in stool. Negative for vomiting.  Genitourinary:  Negative for dysuria and hematuria.  Musculoskeletal:  Negative for arthralgias and back pain.  Skin:  Negative for color change and rash.  Neurological:  Negative for seizures and syncope.  All other systems reviewed and are negative.   Physical Exam Updated Vital Signs BP 111/70   Pulse 63   Temp 98.2 F (36.8 C) (Oral)   Resp 15   Ht 6\' 1"  (1.854 m)   Wt 59.9 kg   SpO2 100%   BMI 17.42 kg/m  Physical Exam Vitals and nursing note reviewed.  Constitutional:      General: He is not in acute distress.    Appearance: He is well-developed.  HENT:     Head: Normocephalic and atraumatic.  Eyes:     Conjunctiva/sclera: Conjunctivae normal.  Cardiovascular:     Rate and Rhythm: Normal rate and regular rhythm.     Heart sounds: No murmur heard. Pulmonary:     Effort: Pulmonary effort is normal. No respiratory distress.     Breath sounds: Normal breath sounds.  Abdominal:     Palpations: Abdomen is soft.     Tenderness: There is no abdominal tenderness.  Musculoskeletal:        General:  No swelling.     Cervical back: Neck supple.  Skin:    General: Skin is warm and dry.     Capillary Refill: Capillary refill takes less than 2 seconds.  Neurological:     Mental Status: He is alert.  Psychiatric:        Mood and Affect: Mood normal.     ED Results / Procedures / Treatments   Labs (all labs ordered are listed, but only abnormal results are displayed) Labs Reviewed  CBC - Abnormal; Notable for the following components:      Result Value   WBC 2.9 (*)    RBC 6.00 (*)    All other components within normal limits  COMPREHENSIVE METABOLIC PANEL  LIPASE, BLOOD  URINALYSIS, ROUTINE W REFLEX MICROSCOPIC  TYPE AND SCREEN     EKG EKG Interpretation  Date/Time:  Tuesday September 29 2022 16:44:15 EDT Ventricular Rate:  79 PR Interval:  136 QRS Duration: 76 QT Interval:  318 QTC Calculation: 364 R Axis:   74 Text Interpretation: Normal sinus rhythm Normal ECG When compared with ECG of 15-Oct-2021 21:24, PREVIOUS ECG IS PRESENT No sig changes Baseline wander and artifact in leads I, II Confirmed by Alvester Chou [phone]) on 09/30/2022 10:58:21 AM  Radiology No results found.  Procedures Procedures    Medications Ordered in ED Medications  acetaminophen (TYLENOL) tablet 650 mg (650 mg Oral Given 09/29/22 1900)  sodium chloride 0.9 % bolus 1,000 mL (0 mLs Intravenous Stopped 09/29/22 2111)  iohexol (OMNIPAQUE) 350 MG/ML injection 60 mL (60 mLs Intravenous Contrast Given 09/29/22 2054)  famotidine (PEPCID) tablet 20 mg (20 mg Oral Given 09/29/22 2113)    ED Course/ Medical Decision Making/ A&P                             Medical Decision Making Amount and/or Complexity of Data Reviewed Labs: ordered. Radiology: ordered.  Risk OTC drugs. Prescription drug management.   1:27 PM 27 yo male with no significant pmh presenting for abdominal pain. Pt is Aox3, no acute distress, afebrile, with stable vitals. Physical exam demonstrates soft abdomen without guarding or rebound. No tenderness currently. No active vomiting. Pt is nontoxic appearing. Differential diagnosis includes but is not limited to gastritis, peptic ulcer disease, pancreatitis. Lower on differential but considered due to generalized abdominal complaints included but not limited to diverticulitis, appendicitis, etc.   Laboratory studies demonstrate stable lipase, renal function, and liver profile. No leukocytosis no signs/symptoms of sepsis. Ct abd/pelvis demonstrates no acute process. No diverticulitis. No appendicitis. Higher concern for gastritis and/or peptic ulcer disease. Pepcid given in ED.  Patient recommended for close GI outpatient  follow up if abdominal pain continues.   Patient in no distress and overall condition improved here in the ED. Detailed discussions were had with the patient regarding current findings, and need for close f/u with PCP or on call doctor. The patient has been instructed to return immediately if the symptoms worsen in any way for re-evaluation. Patient verbalized understanding and is in agreement with current care plan. All questions answered prior to discharge.         Final Clinical Impression(s) / ED Diagnoses Final diagnoses:  Leukopenia, unspecified type  Epigastric abdominal pain    Rx / DC Orders ED Discharge Orders     None         Franne Forts, DO 10/04/22 1330

## 2022-09-29 NOTE — Discharge Instructions (Signed)
Go to the Emergency department for evaluation  

## 2022-10-01 ENCOUNTER — Encounter: Payer: Self-pay | Admitting: Gastroenterology

## 2022-12-08 ENCOUNTER — Encounter: Payer: Self-pay | Admitting: Gastroenterology

## 2022-12-08 ENCOUNTER — Ambulatory Visit (INDEPENDENT_AMBULATORY_CARE_PROVIDER_SITE_OTHER): Payer: Managed Care, Other (non HMO) | Admitting: Gastroenterology

## 2022-12-08 VITALS — BP 106/70 | HR 106 | Ht 74.0 in | Wt 137.0 lb

## 2022-12-08 DIAGNOSIS — K219 Gastro-esophageal reflux disease without esophagitis: Secondary | ICD-10-CM | POA: Diagnosis not present

## 2022-12-08 DIAGNOSIS — R112 Nausea with vomiting, unspecified: Secondary | ICD-10-CM | POA: Diagnosis not present

## 2022-12-08 DIAGNOSIS — R1013 Epigastric pain: Secondary | ICD-10-CM

## 2022-12-08 MED ORDER — PANTOPRAZOLE SODIUM 40 MG PO TBEC: 40.0000 mg | DELAYED_RELEASE_TABLET | Freq: Every day | ORAL | 3 refills | Status: DC

## 2022-12-08 NOTE — Patient Instructions (Addendum)
Stop famotidine and start pantoprazole   You have been scheduled for an endoscopy. Please follow written instructions given to you at your visit today.  If you use inhalers (even only as needed), please bring them with you on the day of your procedure.  If you take any of the following medications, they will need to be adjusted prior to your procedure:   DO NOT TAKE 7 DAYS PRIOR TO TEST- Trulicity (dulaglutide) Ozempic, Wegovy (semaglutide) Mounjaro (tirzepatide) Bydureon Bcise (exanatide extended release)  DO NOT TAKE 1 DAY PRIOR TO YOUR TEST Rybelsus (semaglutide) Adlyxin (lixisenatide) Victoza (liraglutide) Byetta (exanatide) ___________________________________________________________________________     _______________________________________________________  If your blood pressure at your visit was 140/90 or greater, please contact your primary care physician to follow up on this.  _______________________________________________________  If you are age 62 or older, your body mass index should be between 23-30. Your Body mass index is 17.59 kg/m. If this is out of the aforementioned range listed, please consider follow up with your Primary Care Provider.  If you are age 57 or younger, your body mass index should be between 19-25. Your Body mass index is 17.59 kg/m. If this is out of the aformentioned range listed, please consider follow up with your Primary Care Provider.   ________________________________________________________  The [facility] providers would like to encourage you to use [facility] to communicate with providers for non-urgent requests or questions.  Due to long hold times on the telephone, sending your provider a message by [facility] may be a faster and more efficient way to get a response.  Please allow 48 business hours for a response.  Please remember that this is for non-urgent requests.  _______________________________________________________ It was a  pleasure to see you today!  Thank you for trusting me with your gastrointestinal care!

## 2022-12-08 NOTE — Progress Notes (Signed)
Chief Complaint: Epigastric pain Primary GI MD: Gentry Fitz  HPI: 27 year old male with no significant past medical history presents for hospital follow-up  Patient seen 09/29/2022 at [facility] emergency department with epigastric pain ongoing for 8 days with associated nausea and vomiting  Workup revealed stable lipase, liver profile, and new leukocytosis.  CT abdomen pelvis showed no acute process.  Patient was discharged with Pepcid and concern for gastritis/PUD.  Patient states since being put on famotidine 20 Mg once daily his symptoms have improved although he does report breakthrough symptoms.  States spicy foods do not bother him but sometimes acidic foods do.  He reports epigastric pain and GERD.  States he was told he had an ulcer in high school but has had no endoscopic evaluation.  At that time he was taking a significant amount of ibuprofen but has not taken NSAIDs since.  His mother has significant reflux.  He graduated from [employer] and works second shift in [employer]  PREVIOUS GI WORKUP   CT abdomen pelvis with contrast 09/29/2022 - Pancreas, liver, gallbladder all unremarkable - Normal bowel and stomach  Past Medical History:  Diagnosis Date   Headache(784.0)     Past Surgical History:  Procedure Laterality Date   CIRCUMCISION      Current Outpatient Medications  Medication Sig Dispense Refill   amoxicillin-clavulanate (AUGMENTIN) 875-125 MG tablet Take 1 tablet by mouth every 12 (twelve) hours. (Patient not taking: Reported on 09/29/2022) 14 tablet 0   cetirizine (ZYRTEC) 10 MG tablet Take 1 tablet (10 mg total) by mouth daily. 30 tablet 0   fluticasone (FLONASE) 50 MCG/ACT nasal spray Place 1 spray into both nostrils daily. 16 g 2   meclizine (ANTIVERT) 12.5 MG tablet Take 1 tablet (12.5 mg total) by mouth 3 (three) times daily as needed for dizziness. 20 tablet 0   No current facility-administered medications for this visit.    Allergies as of 12/08/2022 -  Review Complete 09/29/2022  Allergen Reaction Noted   Amitriptyline  12/18/2018   Prednisone  12/27/2012   Zofran [ondansetron hcl]  12/18/2018    Family History  Problem Relation Age of Onset   Hyperlipidemia Father    Depression Mother    Heart Problems Maternal Grandmother    Depression Maternal Grandmother    Cancer Paternal Grandmother    Autism Cousin        Paternal 1st Cousin    Social History   Socioeconomic History   Marital status: Single    Spouse name: Not on file   Number of children: Not on file   Years of education: Not on file   Highest education level: Not on file  Occupational History   Not on file  Tobacco Use   Smoking status: Never   Smokeless tobacco: Never  Vaping Use   Vaping status: Never Used  Substance and Sexual Activity   Alcohol use: Yes    Comment: socially   Drug use: No   Sexual activity: Yes  Other Topics Concern   Not on file  Social History Narrative   Not on file   Social Determinants of Health   Financial Resource Strain: Not on file  Food Insecurity: Not on file  Transportation Needs: Not on file  Physical Activity: Not on file  Stress: Not on file  Social Connections: Not on file  Intimate Partner Violence: Not on file    Review of Systems:    Constitutional: No weight loss, fever, chills, weakness or fatigue HEENT:  Eyes: No change in vision               Ears, Nose, Throat:  No change in hearing or congestion Skin: No rash or itching Cardiovascular: No chest pain, chest pressure or palpitations   Respiratory: No SOB or cough Gastrointestinal: See HPI and otherwise negative Genitourinary: No dysuria or change in urinary frequency Neurological: No headache, dizziness or syncope Musculoskeletal: No new muscle or joint pain Hematologic: No bleeding or bruising Psychiatric: No history of depression or anxiety    Physical Exam:  Vital signs: There were no vitals taken for this visit.  Constitutional: NAD,  Well developed, Well nourished, alert and cooperative Head:  Normocephalic and atraumatic. Eyes:   PEERL, EOMI. No icterus. Conjunctiva pink. Respiratory: Respirations even and unlabored. Lungs clear to auscultation bilaterally.   No wheezes, crackles, or rhonchi.  Cardiovascular:  Regular rate and rhythm. No peripheral edema, cyanosis or pallor.  Gastrointestinal:  Soft, nondistended, nontender. No rebound or guarding. Normal bowel sounds. No appreciable masses or hepatomegaly. Rectal:  Not performed.  Msk:  Symmetrical without gross deformities. Without edema, no deformity or joint abnormality.  Neurologic:  Alert and  oriented x4;  grossly normal neurologically.  Skin:   Dry and intact without significant lesions or rashes. Psychiatric: Oriented to person, place and time. Demonstrates good judgement and reason without abnormal affect or behaviors.   RELEVANT LABS AND IMAGING: CBC    Component Value Date/Time   WBC 2.9 (L) 09/29/2022 1646   RBC 6.00 (H) 09/29/2022 1646   HGB 15.9 09/29/2022 1646   HCT 49.0 09/29/2022 1646   PLT 209 09/29/2022 1646   MCV 81.7 09/29/2022 1646   MCH 26.5 09/29/2022 1646   MCHC 32.4 09/29/2022 1646   RDW 13.1 09/29/2022 1646   LYMPHSABS 1.1 10/15/2021 2124   MONOABS 0.5 10/15/2021 2124   EOSABS 0.0 10/15/2021 2124   BASOSABS 0.0 10/15/2021 2124    CMP     Component Value Date/Time   NA 137 09/29/2022 1646   K 3.7 09/29/2022 1646   CL 99 09/29/2022 1646   CO2 26 09/29/2022 1646   GLUCOSE 99 09/29/2022 1646   BUN 11 09/29/2022 1646   CREATININE 1.11 09/29/2022 1646   CALCIUM 9.4 09/29/2022 1646   PROT 7.3 09/29/2022 1646   ALBUMIN 4.4 09/29/2022 1646   AST 19 09/29/2022 1646   ALT 16 09/29/2022 1646   ALKPHOS 63 09/29/2022 1646   BILITOT 0.7 09/29/2022 1646   GFRNONAA >60 09/29/2022 1646   GFRAA >60 12/18/2018 1834     Assessment/Plan:   Abdominal pain, epigastric Nausea and vomiting, unspecified vomiting type Gastroesophageal  reflux disease, unspecified whether esophagitis present DDx includes gastritis, esophagitis, PUD.  Discussed next steps with the patient in regards to conservative versus endoscopic evaluation.  Patient would like to pursue endoscopic evaluation.  - EGD for further evaluation to rule out esophagitis, gastritis, PUD, and Barrett's - I thoroughly discussed the procedure with the patient (at bedside) to include nature of the procedure, alternatives, benefits, and risks (including but not limited to bleeding, infection, perforation, anesthesia/cardiac pulmonary complications).  Patient verbalized understanding and gave verbal consent to proceed with procedure. - Stop famotidine 20 Mg.  Start pantoprazole 40 Mg once daily -Educated patient on lifestyle modifications and provided patient education handout    Makinley Muscato Jolee Ewing [facility] 12/08/2022, 2:02 PM  Cc: No ref. provider found

## 2022-12-09 NOTE — Progress Notes (Signed)
Agree with assessment/plan.  Raj Gupta, MD [facility] [phone]  

## 2022-12-09 NOTE — Addendum Note (Signed)
Addended by: Sunday Spillers on: 12/09/2022 08:54 AM   Modules accepted: Orders

## 2023-01-29 ENCOUNTER — Other Ambulatory Visit: Payer: Self-pay | Admitting: Gastroenterology

## 2023-01-29 ENCOUNTER — Encounter: Payer: Self-pay | Admitting: Gastroenterology

## 2023-01-29 ENCOUNTER — Ambulatory Visit: Payer: Managed Care, Other (non HMO) | Admitting: Gastroenterology

## 2023-01-29 VITALS — BP 104/74 | HR 71 | Temp 99.3°F | Resp 11 | Ht 74.0 in | Wt 137.0 lb

## 2023-01-29 DIAGNOSIS — K2950 Unspecified chronic gastritis without bleeding: Secondary | ICD-10-CM | POA: Diagnosis not present

## 2023-01-29 DIAGNOSIS — R112 Nausea with vomiting, unspecified: Secondary | ICD-10-CM

## 2023-01-29 DIAGNOSIS — K2970 Gastritis, unspecified, without bleeding: Secondary | ICD-10-CM | POA: Diagnosis present

## 2023-01-29 DIAGNOSIS — R1013 Epigastric pain: Secondary | ICD-10-CM | POA: Diagnosis not present

## 2023-01-29 MED ORDER — SODIUM CHLORIDE 0.9 % IV SOLN
500.0000 mL | Freq: Once | INTRAVENOUS | Status: DC
Start: 2023-01-29 — End: 2023-01-29

## 2023-01-29 MED ORDER — PANTOPRAZOLE SODIUM 40 MG PO TBEC: 40.0000 mg | DELAYED_RELEASE_TABLET | Freq: Every day | ORAL | 4 refills | Status: DC

## 2023-01-29 NOTE — Progress Notes (Signed)
Called to room to assist during endoscopic procedure.  Patient ID and intended procedure confirmed with present staff. Received instructions for my participation in the procedure from the performing physician.  

## 2023-01-29 NOTE — Patient Instructions (Addendum)
- Resume previous diet.                           - Continue Protonix 40 mg p.o. qAM (1/2hr before                            breakfast) and Pepcid 20 mg p.o. QHS (after supper)                            x 4 weeks, then continue Protonix 40 mg daily until                            follow-up visit                           - Await pathology results.                           - Avoid nonsteroidals/any alcohol/sodas                           - FU with [facility] in 8-12 weeks, earlier if still                            with problems. The office will call you with this appointment.                            - The findings and recommendations were discussed                            with the patient's family.   YOU HAD AN ENDOSCOPIC PROCEDURE TODAY AT THE [facility]:   Refer to the procedure report that was given to you for any specific questions about what was found during the examination.  If the procedure report does not answer your questions, please call your gastroenterologist to clarify.  If you requested that your care partner not be given the details of your procedure findings, then the procedure report has been included in a sealed envelope for you to review at your convenience later.  YOU SHOULD EXPECT: Some feelings of bloating in the abdomen. Passage of more gas than usual.  Walking can help get rid of the air that was put into your GI tract during the procedure and reduce the bloating. If you had a lower endoscopy (such as a colonoscopy or flexible sigmoidoscopy) you may notice spotting of blood in your stool or on the toilet paper. If you underwent a bowel prep for your procedure, you may not have a normal bowel movement for a few days.  Please Note:  You might notice some irritation and congestion in your nose or some drainage.  This is from the oxygen used during your procedure.  There is no need for concern and it should clear up in a day or  so.  SYMPTOMS TO REPORT IMMEDIATELY:   Following upper endoscopy (EGD)  Vomiting of blood or coffee ground material  New chest pain or pain under the shoulder blades  Painful or persistently difficult swallowing  New shortness of breath  Fever of 100F or higher  Black, tarry-looking stools  For urgent or emergent issues, a gastroenterologist can be reached at any hour by calling [phone]. Do not use MyChart messaging for urgent concerns.    DIET:  We do recommend a small meal at first, but then you may proceed to your regular diet.  Drink plenty of fluids but you should avoid alcoholic beverages for 24 hours.  ACTIVITY:  You should plan to take it easy for the rest of today and you should NOT DRIVE or use heavy machinery until tomorrow (because of the sedation medicines used during the test).    FOLLOW UP: Our staff will call the number listed on your records the next business day following your procedure.  We will call around 7:15- 8:00 am to check on you and address any questions or concerns that you may have regarding the information given to you following your procedure. If we do not reach you, we will leave a message.     If any biopsies were taken you will be contacted by phone or by letter within the next 1-3 weeks.  Please call us at [phone] if you have not heard about the biopsies in 3 weeks.    SIGNATURES/CONFIDENTIALITY: You and/or your care partner have signed paperwork which will be entered into your electronic medical record.  These signatures attest to the fact that that the information above on your After Visit Summary has been reviewed and is understood.  Full responsibility of the confidentiality of this discharge information lies with you and/or your care-partner.

## 2023-01-29 NOTE — Progress Notes (Signed)
Chief Complaint: Epigastric pain Primary GI MD: Gentry Fitz   HPI: 27 year old male with no significant past medical history presents for hospital follow-up   Patient seen 09/29/2022 at [facility] emergency department with epigastric pain ongoing for 8 days with associated nausea and vomiting   Workup revealed stable lipase, liver profile, and new leukocytosis.  CT abdomen pelvis showed no acute process.  Patient was discharged with Pepcid and concern for gastritis/PUD.   Patient states since being put on famotidine 20 Mg once daily his symptoms have improved although he does report breakthrough symptoms.  States spicy foods do not bother him but sometimes acidic foods do.  He reports epigastric pain and GERD.  States he was told he had an ulcer in high school but has had no endoscopic evaluation.  At that time he was taking a significant amount of ibuprofen but has not taken NSAIDs since.  His mother has significant reflux.   He graduated from [employer] and works second shift in [employer]   PREVIOUS GI WORKUP    CT abdomen pelvis with contrast 09/29/2022 - Pancreas, liver, gallbladder all unremarkable - Normal bowel and stomach       Past Medical History:  Diagnosis Date   Headache(784.0)                 Past Surgical History:  Procedure Laterality Date   CIRCUMCISION                    Current Outpatient Medications  Medication Sig Dispense Refill   amoxicillin-clavulanate (AUGMENTIN) 875-125 MG tablet Take 1 tablet by mouth every 12 (twelve) hours. (Patient not taking: Reported on 09/29/2022) 14 tablet 0   cetirizine (ZYRTEC) 10 MG tablet Take 1 tablet (10 mg total) by mouth daily. 30 tablet 0   fluticasone (FLONASE) 50 MCG/ACT nasal spray Place 1 spray into both nostrils daily. 16 g 2   meclizine (ANTIVERT) 12.5 MG tablet Take 1 tablet (12.5 mg total) by mouth 3 (three) times daily as needed for dizziness. 20 tablet 0      No current facility-administered medications  for this visit.             Allergies as of 12/08/2022 - Review Complete 09/29/2022  Allergen Reaction Noted   Amitriptyline   12/18/2018   Prednisone   12/27/2012   Zofran [ondansetron hcl]   12/18/2018           Family History  Problem Relation Age of Onset   Hyperlipidemia Father     Depression Mother     Heart Problems Maternal Grandmother     Depression Maternal Grandmother     Cancer Paternal Grandmother     Autism Cousin          Paternal 1st Cousin          Social History         Socioeconomic History   Marital status: Single      Spouse name: Not on file   Number of children: Not on file   Years of education: Not on file   Highest education level: Not on file  Occupational History   Not on file  Tobacco Use   Smoking status: Never   Smokeless tobacco: Never  Vaping Use   Vaping status: Never Used  Substance and Sexual Activity   Alcohol use: Yes      Comment: socially   Drug use: No   Sexual activity: Yes  Other Topics  Concern   Not on file  Social History Narrative   Not on file    Social Determinants of Health    Financial Resource Strain: Not on file  Food Insecurity: Not on file  Transportation Needs: Not on file  Physical Activity: Not on file  Stress: Not on file  Social Connections: Not on file  Intimate Partner Violence: Not on file      Review of Systems:    Constitutional: No weight loss, fever, chills, weakness or fatigue HEENT: Eyes: No change in vision               Ears, Nose, Throat:  No change in hearing or congestion Skin: No rash or itching Cardiovascular: No chest pain, chest pressure or palpitations   Respiratory: No SOB or cough Gastrointestinal: See HPI and otherwise negative Genitourinary: No dysuria or change in urinary frequency Neurological: No headache, dizziness or syncope Musculoskeletal: No new muscle or joint pain Hematologic: No bleeding or bruising Psychiatric: No history of depression or anxiety       Physical Exam:  Vital signs: There were no vitals taken for this visit.   Constitutional: NAD, Well developed, Well nourished, alert and cooperative Head:  Normocephalic and atraumatic. Eyes:   PEERL, EOMI. No icterus. Conjunctiva pink. Respiratory: Respirations even and unlabored. Lungs clear to auscultation bilaterally.   No wheezes, crackles, or rhonchi.  Cardiovascular:  Regular rate and rhythm. No peripheral edema, cyanosis or pallor.  Gastrointestinal:  Soft, nondistended, nontender. No rebound or guarding. Normal bowel sounds. No appreciable masses or hepatomegaly. Rectal:  Not performed.  Msk:  Symmetrical without gross deformities. Without edema, no deformity or joint abnormality.  Neurologic:  Alert and  oriented x4;  grossly normal neurologically.  Skin:   Dry and intact without significant lesions or rashes. Psychiatric: Oriented to person, place and time. Demonstrates good judgement and reason without abnormal affect or behaviors.     RELEVANT LABS AND IMAGING: CBC Labs (Brief)          Component Value Date/Time    WBC 2.9 (L) 09/29/2022 1646    RBC 6.00 (H) 09/29/2022 1646    HGB 15.9 09/29/2022 1646    HCT 49.0 09/29/2022 1646    PLT 209 09/29/2022 1646    MCV 81.7 09/29/2022 1646    MCH 26.5 09/29/2022 1646    MCHC 32.4 09/29/2022 1646    RDW 13.1 09/29/2022 1646    LYMPHSABS 1.1 10/15/2021 2124    MONOABS 0.5 10/15/2021 2124    EOSABS 0.0 10/15/2021 2124    BASOSABS 0.0 10/15/2021 2124        CMP     Labs (Brief)          Component Value Date/Time    NA 137 09/29/2022 1646    K 3.7 09/29/2022 1646    CL 99 09/29/2022 1646    CO2 26 09/29/2022 1646    GLUCOSE 99 09/29/2022 1646    BUN 11 09/29/2022 1646    CREATININE 1.11 09/29/2022 1646    CALCIUM 9.4 09/29/2022 1646    PROT 7.3 09/29/2022 1646    ALBUMIN 4.4 09/29/2022 1646    AST 19 09/29/2022 1646    ALT 16 09/29/2022 1646    ALKPHOS 63 09/29/2022 1646    BILITOT 0.7 09/29/2022 1646     GFRNONAA >60 09/29/2022 1646    GFRAA >60 12/18/2018 1834          Assessment/Plan:    Abdominal pain, epigastric Nausea and  vomiting, unspecified vomiting type Gastroesophageal reflux disease, unspecified whether esophagitis present DDx includes gastritis, esophagitis, PUD.  Discussed next steps with the patient in regards to conservative versus endoscopic evaluation.  Patient would like to pursue endoscopic evaluation.   - EGD for further evaluation to rule out esophagitis, gastritis, PUD, and Barrett's - I thoroughly discussed the procedure with the patient (at bedside) to include nature of the procedure, alternatives, benefits, and risks (including but not limited to bleeding, infection, perforation, anesthesia/cardiac pulmonary complications).  Patient verbalized understanding and gave verbal consent to proceed with procedure. - Stop famotidine 20 Mg.  Start pantoprazole 40 Mg once daily -Educated patient on lifestyle modifications and provided patient education handout       Bayley McMichael, PA-C [facility]    Attending physician's note   I have taken history, reviewed the chart and examined the patient. I performed a substantive portion of this encounter, including complete performance of at least one of the key components, in conjunction with the APP. I agree with the Advanced Practitioner's note, impression and recommendations.    Edman Circle, MD [facility] [phone]

## 2023-01-29 NOTE — Progress Notes (Signed)
Sedate, gd SR, tolerated procedure well, VSS, report to RN 

## 2023-01-29 NOTE — Op Note (Signed)
[facility] Patient Name: Frank Dougherty Procedure Date: 01/29/2023 3:13 PM MRN: [id number] Endoscopist: Lynann Bologna , MD, [id number] Age: 27 Referring MD:  Date of Birth: [birth year] Gender: Male Account #: [id number] Procedure:                Upper GI endoscopy Indications:              Epi abdominal pain with neg CT AP 09/2022. Nl CBC,                            CMP and lipase Medicines:                Monitored Anesthesia Care Procedure:                Pre-Anesthesia Assessment:                           - Prior to the procedure, a History and Physical                            was performed, and patient medications and                            allergies were reviewed. The patient's tolerance of                            previous anesthesia was also reviewed. The risks                            and benefits of the procedure and the sedation                            options and risks were discussed with the patient.                            All questions were answered, and informed consent                            was obtained. Prior Anticoagulants: The patient has                            taken no anticoagulant or antiplatelet agents. ASA                            Grade Assessment: I - A normal, healthy patient.                            After reviewing the risks and benefits, the patient                            was deemed in satisfactory condition to undergo the                            procedure.  After obtaining informed consent, the endoscope was                            passed under direct vision. Throughout the                            procedure, the patient's blood pressure, pulse, and                            oxygen saturations were monitored continuously. The                            Olympus Scope [id number] was introduced through the                            mouth, and advanced to the second part of duodenum.                             The upper GI endoscopy was accomplished without                            difficulty. The patient tolerated the procedure                            well. Scope In: Scope Out: Findings:                 The examined esophagus was normal.                           The Z-line was regular and was found 40 cm from the                            incisors. Examined by NBI                           Diffuse moderate inflammation characterized by                            erythema was found in the entire examined stomach.                            Biopsies were taken with a cold forceps for                            histology.                           The examined duodenum was normal. Biopsies for                            histology were taken with a cold forceps for                            evaluation of celiac disease. Complications:  No immediate complications. Estimated Blood Loss:     Estimated blood loss: none. Impression:               - Moderate gastritis. Recommendation:           - Patient has a contact number available for                            emergencies. The signs and symptoms of potential                            delayed complications were discussed with the                            patient. Return to normal activities tomorrow.                            Written discharge instructions were provided to the                            patient.                           - Resume previous diet.                           - Continue Protonix 40 mg p.o. qAM (1/2hr before                            breakfast) and Pepcid 20 mg p.o. QHS (after supper)                            x 4 weeks, then continue Protonix 40 mg daily until                            follow-up visit                           - Await pathology results.                           - Avoid nonsteroidals/any alcohol/sodas                           - FU with [facility] in 8-12  weeks, earlier if still                            with problems.                           - The findings and recommendations were discussed                            with the patient's family. Lynann Bologna, MD 01/29/2023 3:33:35 PM This report has been signed electronically.

## 2023-02-01 ENCOUNTER — Telehealth: Payer: Self-pay | Admitting: *Deleted

## 2023-02-01 NOTE — Telephone Encounter (Addendum)
===  View-only below this line=== ----- Message ----- From: Darrel Hoover, RN Sent: 01/29/2023   3:48 PM EDT To: Richardson Chiquito, RN  Hi Dottie,  This patient needs an 8-12 week f/u appointment with [facility].  Please and Thank you.   Dorene Grebe

## 2023-02-01 NOTE — Telephone Encounter (Signed)
  Follow up Call-     01/29/2023    2:41 PM  Call back number  Post procedure Call Back phone  # [phone]  Permission to leave phone message Yes     Patient questions:  Do you have a fever, pain , or abdominal swelling? No. Pain Score  0 *  Have you tolerated food without any problems? Yes.    Have you been able to return to your normal activities? Yes.    Do you have any questions about your discharge instructions: Diet   No. Medications  No. Follow up visit  No.  Do you have questions or concerns about your Care? No.  Actions: * If pain score is 4 or above: No action needed, pain <4.

## 2023-02-01 NOTE — Telephone Encounter (Signed)
Left message for patient to call back. He is scheduled to see Boone Master, PA-C in follow up on 04/02/23.

## 2023-02-02 NOTE — Telephone Encounter (Signed)
Left message for patient to call back  

## 2023-02-03 LAB — SURGICAL PATHOLOGY

## 2023-02-03 NOTE — Telephone Encounter (Signed)
Left additional message for patient to call back. I will also send mychart message to patient.

## 2023-02-12 ENCOUNTER — Encounter: Payer: Self-pay | Admitting: Gastroenterology

## 2023-04-02 ENCOUNTER — Ambulatory Visit (INDEPENDENT_AMBULATORY_CARE_PROVIDER_SITE_OTHER): Payer: Managed Care, Other (non HMO) | Admitting: Gastroenterology

## 2023-04-02 ENCOUNTER — Encounter: Payer: Self-pay | Admitting: Gastroenterology

## 2023-04-02 VITALS — BP 102/68 | HR 98 | Ht 73.0 in | Wt 139.2 lb

## 2023-04-02 DIAGNOSIS — R1013 Epigastric pain: Secondary | ICD-10-CM

## 2023-04-02 DIAGNOSIS — K219 Gastro-esophageal reflux disease without esophagitis: Secondary | ICD-10-CM

## 2023-04-02 DIAGNOSIS — K2970 Gastritis, unspecified, without bleeding: Secondary | ICD-10-CM | POA: Diagnosis not present

## 2023-04-02 MED ORDER — PANTOPRAZOLE SODIUM 20 MG PO TBEC: 20.0000 mg | DELAYED_RELEASE_TABLET | Freq: Every day | ORAL | 0 refills | Status: DC

## 2023-04-02 NOTE — Progress Notes (Signed)
Chief Complaint: Follow up Primary GI MD: Dr. Chales Abrahams  HPI: 27 year old male with medical history as listed below presents for follow-up.  Patient seen 12/08/2022.  At that time he was having epigastric pain, nausea, vomiting, and significant GERD.  Patient underwent EGD 01/29/2023 which showed moderate gastritis Patient was put on pantoprazole 40 Mg once daily until follow-up.  Biopsies for H. pylori negative.  Patient states his symptoms have significantly improved.  He no longer has pain, nausea, vomiting, or GERD.  He did recently have food poisoning but that was an isolated incident that resolved after 1 day.  He is doing well on the pantoprazole and has no issues today.  Feeling much better overall.    Past Medical History:  Diagnosis Date   Anxiety    Bipolar 2 disorder (HCC)    Depression    GERD (gastroesophageal reflux disease)    Headache(784.0)    Migraines     Past Surgical History:  Procedure Laterality Date   CIRCUMCISION     UPPER GASTROINTESTINAL ENDOSCOPY      Current Outpatient Medications  Medication Sig Dispense Refill   famotidine (PEPCID) 20 MG tablet Take 20 mg by mouth daily as needed for heartburn or indigestion.     pantoprazole (PROTONIX) 40 MG tablet Take 1 tablet (40 mg total) by mouth daily. 30 tablet 3   No current facility-administered medications for this visit.    Allergies as of 04/02/2023 - Review Complete 04/02/2023  Allergen Reaction Noted   Amitriptyline  12/18/2018   Prednisone  12/27/2012   Zofran [ondansetron hcl]  12/18/2018    Family History  Problem Relation Age of Onset   Depression Mother    Hyperlipidemia Father    Heart Problems Maternal Grandmother    Depression Maternal Grandmother    Cancer Paternal Grandmother    Breast cancer Paternal Grandmother    Leukemia Paternal Grandmother    Throat cancer Paternal Grandmother    Autism Cousin        Paternal 1st Cousin   Colon cancer Neg Hx    Rectal cancer  Neg Hx    Stomach cancer Neg Hx    Esophageal cancer Neg Hx     Social History   Socioeconomic History   Marital status: Single    Spouse name: Not on file   Number of children: Not on file   Years of education: Not on file   Highest education level: Not on file  Occupational History   Occupation: [employer] operation ([employer])    Comment: [employer]  Tobacco Use   Smoking status: Never   Smokeless tobacco: Never  Vaping Use   Vaping status: Never Used  Substance and Sexual Activity   Alcohol use: Not Currently    Comment: Rare   Drug use: No   Sexual activity: Yes  Other Topics Concern   Not on file  Social History Narrative   Not on file   Social Drivers of Health   Financial Resource Strain: Not on file  Food Insecurity: Not on file  Transportation Needs: Not on file  Physical Activity: Not on file  Stress: Not on file  Social Connections: Not on file  Intimate Partner Violence: Not on file    Review of Systems:    Constitutional: No weight loss, fever, chills, weakness or fatigue HEENT: Eyes: No change in vision               Ears, Nose, Throat:  No change in hearing or  congestion Skin: No rash or itching Cardiovascular: No chest pain, chest pressure or palpitations   Respiratory: No SOB or cough Gastrointestinal: See HPI and otherwise negative Genitourinary: No dysuria or change in urinary frequency Neurological: No headache, dizziness or syncope Musculoskeletal: No new muscle or joint pain Hematologic: No bleeding or bruising Psychiatric: No history of depression or anxiety    Physical Exam:  Vital signs: BP 102/68   Pulse 98   Ht 6\' 1"  (1.854 m)   Wt 139 lb 4 oz (63.2 kg)   SpO2 99%   BMI 18.37 kg/m   Constitutional: NAD, Well developed, Well nourished, alert and cooperative Head:  Normocephalic and atraumatic. Eyes:   PEERL, EOMI. No icterus. Conjunctiva pink. Respiratory: Respirations even and unlabored. Lungs clear to auscultation bilaterally.    No wheezes, crackles, or rhonchi.  Cardiovascular:  Regular rate and rhythm. No peripheral edema, cyanosis or pallor.  Gastrointestinal:  Soft, nondistended, nontender. No rebound or guarding. Normal bowel sounds. No appreciable masses or hepatomegaly. Rectal:  Not performed.  Msk:  Symmetrical without gross deformities. Without edema, no deformity or joint abnormality.  Neurologic:  Alert and  oriented x4;  grossly normal neurologically.  Skin:   Dry and intact without significant lesions or rashes. Psychiatric: Oriented to person, place and time. Demonstrates good judgement and reason without abnormal affect or behaviors.   RELEVANT LABS AND IMAGING: CBC    Component Value Date/Time   WBC 2.9 (L) 09/29/2022 1646   RBC 6.00 (H) 09/29/2022 1646   HGB 15.9 09/29/2022 1646   HCT 49.0 09/29/2022 1646   PLT 209 09/29/2022 1646   MCV 81.7 09/29/2022 1646   MCH 26.5 09/29/2022 1646   MCHC 32.4 09/29/2022 1646   RDW 13.1 09/29/2022 1646   LYMPHSABS 1.1 10/15/2021 2124   MONOABS 0.5 10/15/2021 2124   EOSABS 0.0 10/15/2021 2124   BASOSABS 0.0 10/15/2021 2124    CMP     Component Value Date/Time   NA 137 09/29/2022 1646   K 3.7 09/29/2022 1646   CL 99 09/29/2022 1646   CO2 26 09/29/2022 1646   GLUCOSE 99 09/29/2022 1646   BUN 11 09/29/2022 1646   CREATININE 1.11 09/29/2022 1646   CALCIUM 9.4 09/29/2022 1646   PROT 7.3 09/29/2022 1646   ALBUMIN 4.4 09/29/2022 1646   AST 19 09/29/2022 1646   ALT 16 09/29/2022 1646   ALKPHOS 63 09/29/2022 1646   BILITOT 0.7 09/29/2022 1646   GFRNONAA >60 09/29/2022 1646   GFRAA >60 12/18/2018 1834     Assessment/Plan:   Gastritis GERD Epigastric pain Resolution of symptoms on pantoprazole 40 Mg once daily with EGD showing H. pylori negative gastritis.  No issues at this time. - Decrease to pantoprazole 20 Mg for 30 days.  Once completion of 30 days can try to wean off of medication. - If symptoms return when decreasing medication or  stopping medication please send a MyChart message and let us know.  If symptoms return we will get back on medication - Follow-up as needed - Educated on lifestyle modifications  Teona Vargus Jolee Ewing [facility] 04/02/2023, 1:50 PM  Cc: No ref. provider found

## 2023-04-02 NOTE — Patient Instructions (Signed)
We have sent the following medications to your pharmacy for you to pick up at your convenience: pantoprazole 20 mg daily x 30 days.   Then stop.   _______________________________________________________  If your blood pressure at your visit was 140/90 or greater, please contact your primary care physician to follow up on this.  _______________________________________________________  If you are age 27 or older, your body mass index should be between 23-30. Your Body mass index is 18.37 kg/m. If this is out of the aforementioned range listed, please consider follow up with your Primary Care Provider.  If you are age 8 or younger, your body mass index should be between 19-25. Your Body mass index is 18.37 kg/m. If this is out of the aformentioned range listed, please consider follow up with your Primary Care Provider.   ________________________________________________________  [facility] [place] [facility] providers would like to encourage you to use [facility] to communicate with providers for non-urgent requests or questions.  Due to long hold times on the telephone, sending your provider a message by [facility] may be a faster and more efficient way to get a response.  Please allow 48 business hours for a response.  Please remember that this is for non-urgent requests.  _______________________________________________________

## 2023-04-07 ENCOUNTER — Other Ambulatory Visit: Payer: Self-pay | Admitting: Gastroenterology

## 2023-04-29 ENCOUNTER — Other Ambulatory Visit: Payer: Self-pay | Admitting: Gastroenterology

## 2023-05-10 ENCOUNTER — Ambulatory Visit
Admission: RE | Admit: 2023-05-10 | Discharge: 2023-05-10 | Disposition: A | Discharge status: Home / Self Care | Payer: Managed Care, Other (non HMO) | Source: Ambulatory Visit | Attending: Physician Assistant | Admitting: Physician Assistant

## 2023-05-10 ENCOUNTER — Other Ambulatory Visit: Payer: Self-pay

## 2023-05-10 VITALS — BP 122/84 | HR 87 | Temp 98.1°F | Resp 18

## 2023-05-10 DIAGNOSIS — J329 Chronic sinusitis, unspecified: Secondary | ICD-10-CM

## 2023-05-10 DIAGNOSIS — R062 Wheezing: Secondary | ICD-10-CM

## 2023-05-10 DIAGNOSIS — J40 Bronchitis, not specified as acute or chronic: Secondary | ICD-10-CM

## 2023-05-10 MED ORDER — AMOXICILLIN-POT CLAVULANATE 875-125 MG PO TABS: 1.0000 | ORAL_TABLET | Freq: Two times a day (BID) | ORAL | 0 refills | Status: DC

## 2023-05-10 MED ORDER — ALBUTEROL SULFATE HFA 108 (90 BASE) MCG/ACT IN AERS: 2.0000 | INHALATION_SPRAY | Freq: Once | RESPIRATORY_TRACT | Status: DC

## 2023-05-10 MED ORDER — ALBUTEROL SULFATE (2.5 MG/3ML) 0.083% IN NEBU
2.5000 mg | INHALATION_SOLUTION | Freq: Once | RESPIRATORY_TRACT | Status: AC
  Administered 2023-05-10: 2.5 mg via RESPIRATORY_TRACT

## 2023-05-10 MED ORDER — PROMETHAZINE-DM 6.25-15 MG/5ML PO SYRP: 5.0000 mL | ORAL_SOLUTION | Freq: Two times a day (BID) | ORAL | 0 refills | Status: AC | PRN

## 2023-05-10 MED ORDER — AEROCHAMBER PLUS FLO-VU LARGE MISC: 1.0000 | Freq: Once | Status: DC

## 2023-05-10 MED ORDER — ALBUTEROL SULFATE HFA 108 (90 BASE) MCG/ACT IN AERS: 1.0000 | INHALATION_SPRAY | Freq: Four times a day (QID) | RESPIRATORY_TRACT | 0 refills | Status: AC | PRN

## 2023-05-10 NOTE — ED Provider Notes (Signed)
[facility]    CSN: [id number] Arrival date & time: 05/10/23  0947      History   Chief Complaint Chief Complaint  Patient presents with   URI    HPI Frank Dougherty is a 28 y.o. male.   Patient presents today with a week plus long history of URI symptoms including cough, congestion, sinus pressure, headache, postnasal drainage.  Denies any fever though he did initially have fever but this has resolved after a few days of his symptoms.  Denies any chest pain, shortness of breath, nausea, vomiting, diarrhea.  Denies any known sick contacts.  He has been using DayQuil, NyQuil, Cepacol without improvement of symptoms.  He denies history of asthma, allergies, COPD, smoking.  Denies any recent antibiotics or steroids.  He is having difficulty with his daily activities including sleeping at night as a result of the symptoms.    Past Medical History:  Diagnosis Date   Anxiety    Bipolar 2 disorder (HCC)    Depression    GERD (gastroesophageal reflux disease)    Headache(784.0)    Migraines     Patient Active Problem List   Diagnosis Date Noted   Strain of quadriceps tendon 06/08/2012    Past Surgical History:  Procedure Laterality Date   CIRCUMCISION     UPPER GASTROINTESTINAL ENDOSCOPY         Home Medications    Prior to Admission medications   Medication Sig Start Date End Date Taking? Authorizing Provider  albuterol (VENTOLIN HFA) 108 (90 Base) MCG/ACT inhaler Inhale 1-2 puffs into the lungs every 6 (six) hours as needed for wheezing or shortness of breath. 05/10/23  Yes Arica Bevilacqua K, PA-C  amoxicillin-clavulanate (AUGMENTIN) 875-125 MG tablet Take 1 tablet by mouth every 12 (twelve) hours. 05/10/23  Yes Mohamed Portlock K, PA-C  promethazine-dextromethorphan (PROMETHAZINE-DM) 6.25-15 MG/5ML syrup Take 5 mLs by mouth 2 (two) times daily as needed for cough. 05/10/23  Yes Abriel Hattery K, PA-C  famotidine (PEPCID) 20 MG tablet Take 20 mg by mouth daily as  needed for heartburn or indigestion.    [provider]  pantoprazole (PROTONIX) 20 MG tablet TAKE 1 TABLET BY MOUTH EVERY DAY 04/29/23   Legrand Como, PA-C    Family History Family History  Problem Relation Age of Onset   Depression Mother    Hyperlipidemia Father    Heart Problems Maternal Grandmother    Depression Maternal Grandmother    Cancer Paternal Grandmother    Breast cancer Paternal Grandmother    Leukemia Paternal Grandmother    Throat cancer Paternal Grandmother    Autism Cousin        Paternal 1st Cousin   Colon cancer Neg Hx    Rectal cancer Neg Hx    Stomach cancer Neg Hx    Esophageal cancer Neg Hx     Social History Social History   Tobacco Use   Smoking status: Never   Smokeless tobacco: Never  Vaping Use   Vaping status: Never Used  Substance Use Topics   Alcohol use: Not Currently    Comment: Rare   Drug use: No     Allergies   Amitriptyline, Prednisone, and Zofran [ondansetron hcl]   Review of Systems Review of Systems  Constitutional:  Positive for activity change. Negative for appetite change, fatigue and fever (Resolved).  HENT:  Positive for congestion, postnasal drip, sinus pressure and sore throat. Negative for sneezing.   Respiratory:  Positive for cough. Negative  for shortness of breath.   Cardiovascular:  Negative for chest pain.  Gastrointestinal:  Negative for abdominal pain, diarrhea, nausea and vomiting.  Neurological:  Positive for headaches. Negative for dizziness and light-headedness.     Physical Exam Triage Vital Signs ED Triage Vitals  Encounter Vitals Group     BP 05/10/23 1032 122/84     Systolic BP Percentile --      Diastolic BP Percentile --      Pulse Rate 05/10/23 1032 87     Resp 05/10/23 1032 18     Temp 05/10/23 1032 98.1 F (36.7 C)     Temp Source 05/10/23 1032 Oral     SpO2 05/10/23 1032 98 %     Weight --      Height --      Head Circumference --      Peak Flow --      Pain  Score 05/10/23 1033 1     Pain Loc --      Pain Education --      Exclude from Growth Chart --    No data found.  Updated Vital Signs BP 122/84 (BP Location: Left Arm)   Pulse 87   Temp 98.1 F (36.7 C) (Oral)   Resp 18   SpO2 98%   Visual Acuity Right Eye Distance:   Left Eye Distance:   Bilateral Distance:    Right Eye Near:   Left Eye Near:    Bilateral Near:     Physical Exam Vitals reviewed.  Constitutional:      General: He is awake.     Appearance: Normal appearance. He is well-developed. He is not ill-appearing.     Comments: Very pleasant male appears stated age in no acute distress sitting comfortably in exam room  HENT:     Head: Normocephalic and atraumatic.     Right Ear: Tympanic membrane, ear canal and external ear normal. Tympanic membrane is not erythematous or bulging.     Left Ear: Tympanic membrane, ear canal and external ear normal. Tympanic membrane is not erythematous or bulging.     Nose: Rhinorrhea present. Rhinorrhea is clear.     Mouth/Throat:     Pharynx: Uvula midline. Posterior oropharyngeal erythema and postnasal drip present. No oropharyngeal exudate or uvula swelling.  Cardiovascular:     Rate and Rhythm: Normal rate and regular rhythm.     Heart sounds: Normal heart sounds, S1 normal and S2 normal. No murmur heard. Pulmonary:     Effort: Pulmonary effort is normal. No accessory muscle usage or respiratory distress.     Breath sounds: No stridor. Examination of the right-lower field reveals decreased breath sounds. Examination of the left-lower field reveals decreased breath sounds. Decreased breath sounds and wheezing present. No rhonchi or rales.  Neurological:     Mental Status: He is alert.  Psychiatric:        Behavior: Behavior is cooperative.      UC Treatments / Results  Labs (all labs ordered are listed, but only abnormal results are displayed) Labs Reviewed - No data to display  EKG   Radiology No results  found.  Procedures Procedures (including critical care time)  Medications Ordered in UC Medications  albuterol (PROVENTIL) (2.5 MG/3ML) 0.083% nebulizer solution 2.5 mg (2.5 mg Nebulization Given 05/10/23 1135)    Initial Impression / Assessment and Plan / UC Course  I have reviewed the triage vital signs and the nursing notes.  Pertinent labs & imaging results  that were available during my care of the patient were reviewed by me and considered in my medical decision making (see chart for details).     Patient is well-appearing, afebrile, nontoxic, nontachycardic.  No indication for viral testing as patient has been symptomatic for over a week and this would not change management.  Chest x-ray was deferred as wheezing resolved after in office breathing treatment and oxygen saturation was 98%.  He did have wheezing and was given albuterol with improvement of symptoms.  He was sent home with an albuterol inhaler with instruction to use this every 4-6 hours as needed.  Given prolonged and worsening symptoms will cover for secondary bacterial infection with Augmentin twice daily for 7 days.  He was also given Promethazine DM for cough.  We discussed that this can be sedating and he is not to drive drink alcohol with taking it.  He can use over-the-counter medication including Mucinex, Flonase, Tylenol for additional symptom relief.  He is to rest and drink plenty of fluid.  If his symptoms are not improving within a week he is to return for reevaluation.  If anything worsens he needs to be seen immediately including chest pain, shortness of breath, high fever, worsening cough, nausea/vomiting interfering with oral intake.  Strict return precautions given.  Work excuse note provided.  Final Clinical Impressions(s) / UC Diagnoses   Final diagnoses:  Sinobronchitis  Wheezing     Discharge Instructions      We are treating you for a sinus/bronchitis infection.  Start Augmentin twice daily for 7  days.  Take this with food as it can upset your stomach.  Use albuterol every 4-6 hours as needed with shortness of breath, coughing fits, wheezing, chest tightness.  Take Promethazine DM for cough.  This will make you sleepy so do not drive or drink alcohol with taking it.  Use Tylenol, Mucinex, Flonase, nasal saline/sinus rinses for additional symptom relief.  Make sure you rest and drink plenty of fluid.  If your symptoms are not improving or if anything worsens and you have high fever, worsening cough, shortness of breath, chest pain you need to be seen immediately.     ED Prescriptions     Medication Sig Dispense Auth. Provider   albuterol (VENTOLIN HFA) 108 (90 Base) MCG/ACT inhaler Inhale 1-2 puffs into the lungs every 6 (six) hours as needed for wheezing or shortness of breath. 18 g Cherly Erno K, PA-C   amoxicillin-clavulanate (AUGMENTIN) 875-125 MG tablet Take 1 tablet by mouth every 12 (twelve) hours. 14 tablet Alexi Geibel K, PA-C   promethazine-dextromethorphan (PROMETHAZINE-DM) 6.25-15 MG/5ML syrup Take 5 mLs by mouth 2 (two) times daily as needed for cough. 118 mL Cheikh Bramble K, PA-C      PDMP not reviewed this encounter.   Jeani Hawking, PA-C 05/10/23 1157

## 2023-05-10 NOTE — Discharge Instructions (Addendum)
We are treating you for a sinus/bronchitis infection.  Start Augmentin twice daily for 7 days.  Take this with food as it can upset your stomach.  Use albuterol every 4-6 hours as needed with shortness of breath, coughing fits, wheezing, chest tightness.  Take Promethazine DM for cough.  This will make you sleepy so do not drive or drink alcohol with taking it.  Use Tylenol, Mucinex, Flonase, nasal saline/sinus rinses for additional symptom relief.  Make sure you rest and drink plenty of fluid.  If your symptoms are not improving or if anything worsens and you have high fever, worsening cough, shortness of breath, chest pain you need to be seen immediately.

## 2023-05-10 NOTE — ED Triage Notes (Signed)
Upper Respiratory Infection; coughing up mucus, been a week and coughing won't stop. - Entered by patient  Cold Sx over week ago. Still has cough, runny nose and sinus pressure

## 2023-06-05 ENCOUNTER — Other Ambulatory Visit: Payer: Self-pay | Admitting: Gastroenterology

## 2023-07-27 ENCOUNTER — Encounter: Payer: Self-pay | Admitting: Emergency Medicine

## 2023-07-27 ENCOUNTER — Ambulatory Visit
Admission: EM | Admit: 2023-07-27 | Discharge: 2023-07-27 | Disposition: A | Discharge status: Home / Self Care | Attending: Family Medicine | Admitting: Family Medicine

## 2023-07-27 DIAGNOSIS — K120 Recurrent oral aphthae: Secondary | ICD-10-CM

## 2023-07-27 DIAGNOSIS — J029 Acute pharyngitis, unspecified: Secondary | ICD-10-CM | POA: Diagnosis not present

## 2023-07-27 LAB — POCT RAPID STREP A (OFFICE): Rapid Strep A Screen: NEGATIVE

## 2023-07-27 MED ORDER — LIDOCAINE VISCOUS HCL 2 % MT SOLN: 15.0000 mL | OROMUCOSAL | 0 refills | Status: AC | PRN

## 2023-07-27 MED ORDER — AMOXICILLIN-POT CLAVULANATE 875-125 MG PO TABS: 1.0000 | ORAL_TABLET | Freq: Two times a day (BID) | ORAL | 0 refills | Status: AC

## 2023-07-27 MED ORDER — PREDNISONE 10 MG PO TABS: 10.0000 mg | ORAL_TABLET | Freq: Every day | ORAL | 0 refills | Status: AC

## 2023-07-27 NOTE — Discharge Instructions (Signed)
 Start prednisone tomorrow take 10 mg daily for 5 days to reduce the inflammation. Lidocaine viscous 15 ml every 3 hours for mouth pain For throat infection start 8Augmentin twice daily for 7 days.

## 2023-07-27 NOTE — ED Provider Notes (Signed)
 Frank Dougherty    CSN: [id number] Arrival date & time: 07/27/23  1117      History   Chief Complaint Chief Complaint  Patient presents with   Mouth Lesions   Sore Throat    HPI Frank Dougherty is a 28 y.o. male.  Patient here for evaluation of a canker sore and 2 days of sore throat.  Has not had a fever.  Denies any cough or congestion.  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  Pain with eating and drinking.  Denies any other symptoms.  Past Medical History:  Diagnosis Date   Anxiety    Bipolar 2 disorder (HCC)    Depression    GERD (gastroesophageal reflux disease)    Headache(784.0)    Migraines     Patient Active Problem List   Diagnosis Date Noted   Strain of quadriceps tendon 06/08/2012    Past Surgical History:  Procedure Laterality Date   CIRCUMCISION     UPPER GASTROINTESTINAL ENDOSCOPY         Home Medications    Prior to Admission medications   Medication Sig Start Date End Date Taking? Authorizing Provider  lidocaine (XYLOCAINE) 2 % solution Use as directed 15 mLs in the mouth or throat every 3 (three) hours as needed (mouth pain). 07/27/23  Yes Buena Carmine, NP  predniSONE (DELTASONE) 10 MG tablet Take 1 tablet (10 mg total) by mouth daily with breakfast for 5 days. 07/27/23 08/01/23 Yes Buena Carmine, NP  albuterol (VENTOLIN HFA) 108 (90 Base) MCG/ACT inhaler Inhale 1-2 puffs into the lungs every 6 (six) hours as needed for wheezing or shortness of breath. 05/10/23   Raspet, Erin K, PA-C  amoxicillin-clavulanate (AUGMENTIN) 875-125 MG tablet Take 1 tablet by mouth every 12 (twelve) hours. 07/27/23   Buena Carmine, NP  famotidine (PEPCID) 20 MG tablet Take 20 mg by mouth daily as needed for heartburn or indigestion.    [provider]  pantoprazole (PROTONIX) 20 MG tablet TAKE 1 TABLET BY MOUTH EVERY DAY 06/07/23   McMichael, Bayley M, PA-C  promethazine-dextromethorphan (PROMETHAZINE-DM) 6.25-15 MG/5ML syrup Take 5 mLs  by mouth 2 (two) times daily as needed for cough. 05/10/23   Raspet, Betsey Brow, PA-C    Family History Family History  Problem Relation Age of Onset   Depression Mother    Hyperlipidemia Father    Heart Problems Maternal Grandmother    Depression Maternal Grandmother    Cancer Paternal Grandmother    Breast cancer Paternal Grandmother    Leukemia Paternal Grandmother    Throat cancer Paternal Grandmother    Autism Cousin        Paternal 1st Cousin   Colon cancer Neg Hx    Rectal cancer Neg Hx    Stomach cancer Neg Hx    Esophageal cancer Neg Hx     Social History Social History   Tobacco Use   Smoking status: Never   Smokeless tobacco: Never  Vaping Use   Vaping status: Never Used  Substance Use Topics   Alcohol use: Not Currently    Comment: Rare   Drug use: No     Allergies   Latex, Amitriptyline, Prednisone, and Zofran [ondansetron hcl]   Review of Systems Review of Systems  HENT:  Positive for mouth sores.      Physical Exam Triage Vital Signs ED Triage Vitals  Encounter Vitals Group     BP 07/27/23 1331 118/79     Systolic BP  Percentile --      Diastolic BP Percentile --      Pulse Rate 07/27/23 1331 (!) 101     Resp 07/27/23 1331 16     Temp 07/27/23 1331 98.3 F (36.8 C)     Temp Source 07/27/23 1331 Oral     SpO2 07/27/23 1331 98 %     Weight 07/27/23 1329 138 lb (62.6 kg)     Height --      Head Circumference --      Peak Flow --      Pain Score 07/27/23 1327 9     Pain Loc --      Pain Education --      Exclude from Growth Chart --    No data found.  Updated Vital Signs BP 118/79 (BP Location: Right Arm)   Pulse (!) 101   Temp 98.3 F (36.8 C) (Oral)   Resp 16   Wt 138 lb (62.6 kg)   SpO2 98%   BMI 18.21 kg/m   Visual Acuity Right Eye Distance:   Left Eye Distance:   Bilateral Distance:    Right Eye Near:   Left Eye Near:    Bilateral Near:     Physical Exam Vitals reviewed.  Constitutional:      Appearance: He is  well-developed.  HENT:     Head: Normocephalic and atraumatic.     Right Ear: Tympanic membrane and ear canal normal.     Left Ear: Tympanic membrane and ear canal normal.     Mouth/Throat:     Palate: Lesions present.     Pharynx: Pharyngeal swelling, oropharyngeal exudate, posterior oropharyngeal erythema and uvula swelling present.     Tonsils: No tonsillar exudate or tonsillar abscesses. 3+ on the right. 3+ on the left.   Cardiovascular:     Rate and Rhythm: Regular rhythm. Tachycardia present.  Pulmonary:     Effort: Pulmonary effort is normal.     Breath sounds: Normal breath sounds.  Musculoskeletal:     Cervical back: Normal range of motion.  Lymphadenopathy:     Cervical: Cervical adenopathy present.  Skin:    General: Skin is warm and dry.     Findings: No erythema.  Neurological:     Mental Status: He is alert.      UC Treatments / Results  Labs (all labs ordered are listed, but only abnormal results are displayed) Labs Reviewed  POCT RAPID STREP A (OFFICE) - Normal    EKG   Radiology No results found.  Procedures Procedures (including critical care time)  Medications Ordered in UC Medications - No data to display  Initial Impression / Assessment and Plan / UC Course  I have reviewed the triage vital signs and the nursing notes.  Pertinent labs & imaging results that were available during my care of the patient were reviewed by me and considered in my medical decision making (see chart for details).    Recurrent canker sores and acute pharyngitis, rapid strep is negative. Given exam findings, suffering empirically for suspected strep infection.  Lidocaine visicous RN for throat pain.  Prednisone 10 mg daily for 5 days to reduce swelling and inflammation.  Return precautions given if symptoms worsen or do not improve. Final Clinical Impressions(s) / UC Diagnoses   Final diagnoses:  Recurrent canker sores  Acute pharyngitis, unspecified etiology      Discharge Instructions      Start prednisone tomorrow take 10 mg daily for 5 days  to reduce the inflammation. Lidocaine viscous 15 ml every 3 hours for mouth pain For throat infection start 8Augmentin twice daily for 7 days.     ED Prescriptions     Medication Sig Dispense Auth. Provider   amoxicillin-clavulanate (AUGMENTIN) 875-125 MG tablet Take 1 tablet by mouth every 12 (twelve) hours. 14 tablet Buena Carmine, NP   predniSONE (DELTASONE) 10 MG tablet Take 1 tablet (10 mg total) by mouth daily with breakfast for 5 days. 5 tablet Buena Carmine, NP   lidocaine (XYLOCAINE) 2 % solution Use as directed 15 mLs in the mouth or throat every 3 (three) hours as needed (mouth pain). 100 mL Buena Carmine, NP      PDMP not reviewed this encounter.   Buena Carmine, NP 07/29/23 [phone]

## 2023-07-27 NOTE — ED Triage Notes (Signed)
 Pt presents with canker sores since last Tuesday and sore throat x 2 days.
